# Patient Record
Sex: Male | Born: 1975 | Race: Black or African American | Hispanic: No | Smoking: Current every day smoker
Health system: Southern US, Community
[De-identification: ages and names within clinical notes are randomized; demographics above are authoritative.]

## PROBLEM LIST (undated history)

## (undated) DIAGNOSIS — E119 Type 2 diabetes mellitus without complications: Secondary | ICD-10-CM

---

## 2001-12-29 DIAGNOSIS — E785 Hyperlipidemia, unspecified: Secondary | ICD-10-CM | POA: Insufficient documentation

## 2001-12-29 DIAGNOSIS — E1165 Type 2 diabetes mellitus with hyperglycemia: Secondary | ICD-10-CM | POA: Insufficient documentation

## 2001-12-29 DIAGNOSIS — Z9889 Other specified postprocedural states: Secondary | ICD-10-CM | POA: Insufficient documentation

## 2001-12-29 DIAGNOSIS — F17200 Nicotine dependence, unspecified, uncomplicated: Secondary | ICD-10-CM | POA: Insufficient documentation

## 2001-12-29 DIAGNOSIS — I10 Essential (primary) hypertension: Secondary | ICD-10-CM | POA: Insufficient documentation

## 2003-04-26 DIAGNOSIS — B338 Other specified viral diseases: Secondary | ICD-10-CM | POA: Insufficient documentation

## 2017-12-17 ENCOUNTER — Other Ambulatory Visit: Payer: Self-pay

## 2017-12-17 ENCOUNTER — Emergency Department
Admission: EM | Admit: 2017-12-17 | Discharge: 2017-12-17 | Disposition: A | Discharge status: Home / Self Care | Payer: Self-pay | Attending: Student in an Organized Health Care Education/Training Program | Admitting: Student in an Organized Health Care Education/Training Program

## 2017-12-17 ENCOUNTER — Encounter: Payer: Self-pay | Admitting: Intensive Care

## 2017-12-17 DIAGNOSIS — M79601 Pain in right arm: Secondary | ICD-10-CM | POA: Insufficient documentation

## 2017-12-17 DIAGNOSIS — F17210 Nicotine dependence, cigarettes, uncomplicated: Secondary | ICD-10-CM | POA: Insufficient documentation

## 2017-12-17 DIAGNOSIS — R739 Hyperglycemia, unspecified: Secondary | ICD-10-CM

## 2017-12-17 DIAGNOSIS — E1165 Type 2 diabetes mellitus with hyperglycemia: Secondary | ICD-10-CM | POA: Insufficient documentation

## 2017-12-17 LAB — CBC WITH DIFFERENTIAL/PLATELET
BASOS ABS: 0.2 10*3/uL — AB (ref 0–0.1)
Basophils Relative: 1 %
Eosinophils Absolute: 0.4 10*3/uL (ref 0–0.7)
Eosinophils Relative: 4 %
HEMATOCRIT: 47.4 % (ref 40.0–52.0)
Hemoglobin: 15.8 g/dL (ref 13.0–18.0)
LYMPHS PCT: 21 %
Lymphs Abs: 2.5 10*3/uL (ref 1.0–3.6)
MCH: 29.4 pg (ref 26.0–34.0)
MCHC: 33.5 g/dL (ref 32.0–36.0)
MCV: 88 fL (ref 80.0–100.0)
Monocytes Absolute: 0.6 10*3/uL (ref 0.2–1.0)
Monocytes Relative: 5 %
NEUTROS ABS: 8.4 10*3/uL — AB (ref 1.4–6.5)
Neutrophils Relative %: 69 %
Platelets: 234 10*3/uL (ref 150–440)
RBC: 5.38 MIL/uL (ref 4.40–5.90)
RDW: 13.7 % (ref 11.5–14.5)
WBC: 12.1 10*3/uL — AB (ref 3.8–10.6)

## 2017-12-17 LAB — COMPREHENSIVE METABOLIC PANEL
ALK PHOS: 130 U/L — AB (ref 38–126)
ALT: 43 U/L (ref 17–63)
AST: 43 U/L — AB (ref 15–41)
Albumin: 4.3 g/dL (ref 3.5–5.0)
Anion gap: 9 (ref 5–15)
BUN: 18 mg/dL (ref 6–20)
CHLORIDE: 102 mmol/L (ref 101–111)
CO2: 22 mmol/L (ref 22–32)
CREATININE: 1.12 mg/dL (ref 0.61–1.24)
Calcium: 9.5 mg/dL (ref 8.9–10.3)
GFR calc Af Amer: >60 mL/min (ref >60)
Glucose, Bld: 328 mg/dL — ABNORMAL HIGH (ref 65–99)
Potassium: 4.5 mmol/L (ref 3.5–5.1)
Sodium: 133 mmol/L — ABNORMAL LOW (ref 135–145)
Total Bilirubin: 1.2 mg/dL (ref 0.3–1.2)
Total Protein: 8 g/dL (ref 6.5–8.1)

## 2017-12-17 LAB — GLUCOSE, CAPILLARY: Glucose-Capillary: 327 mg/dL — ABNORMAL HIGH (ref 65–99)

## 2017-12-17 MED ORDER — MELOXICAM 15 MG PO TABS: 15.0000 mg | ORAL_TABLET | Freq: Every day | ORAL | 0 refills | Status: DC

## 2017-12-17 NOTE — Discharge Instructions (Signed)
Please establish a primary care provider as soon as possible.  Return to the ER for symptoms that change or worsen if you are unable to schedule an appointment.

## 2017-12-17 NOTE — ED Triage Notes (Addendum)
Patient c/o Right arm pain while at work today. Patient states "my job told me to leave and come get it checked and bring them back a work note" Patient blood sugar 327 in triage. Does not take medicines for diabetes after losing weight.

## 2017-12-17 NOTE — ED Provider Notes (Signed)
[facility] Emergency Department Provider Note ____________________________________________  Time seen: Approximately 2:32 PM  I have reviewed the triage vital signs and the nursing notes.   HISTORY  Chief Complaint Arm Pain (Right)    HPI Jon EasterlySamuele Hoover is a 42 y.o. male who presents to the emergency department for evaluation and treatment of right arm pain.  Patient states that this is a chronic issue that occasionally flares up with overuse.  He states that while at work, he was trying to cut something and was unable to complete the job.  His supervisor sent him home and advised him not to return to work until he had been evaluated.  No alleviating measures have been attempted for this complaint prior to arrival.  History reviewed. No pertinent past medical history.  There are no active problems to display for this patient.   History reviewed. No pertinent surgical history.  Prior to Admission medications   Medication Sig Start Date End Date Taking? Authorizing Provider  meloxicam (MOBIC) 15 MG tablet Take 1 tablet (15 mg total) by mouth daily. 12/17/17   Chinita Pesterriplett, Carisma Troupe B, FNP    Allergies Patient has no known allergies.  History reviewed. No pertinent family history.  Social History Social History   Tobacco Use  . Smoking status: Current Every Day Smoker    Types: Cigarettes  . Smokeless tobacco: Never Used  Substance Use Topics  . Alcohol use: Yes    Comment: occ  . Drug use: No    Review of Systems Constitutional: Negative for recent illness or injury.  Positive for polydipsia Cardiovascular: Negative for chest pain or shortness of breath. Respiratory: Negative for cough. Musculoskeletal: Positive for right forearm pain Skin: Positive for scarring in the area of pain.  Negative for new injury. Neurological: Negative for paresthesias or weakness.  ____________________________________________   PHYSICAL EXAM:  VITAL SIGNS: ED Triage  Vitals [12/17/17 1344]  Enc Vitals Group     BP (!) 163/90     Pulse Rate (!) 109     Resp 18     Temp 98.7 F (37.1 C)     Temp Source Oral     SpO2 99 %     Weight 273 lb (123.8 kg)     Height 5\' 8"  (1.727 m)     Head Circumference      Peak Flow      Pain Score 6     Pain Loc      Pain Edu?      Excl. in GC?     Constitutional: Alert and oriented. Well appearing and in no acute distress. Eyes: Conjunctivae are clear without discharge or drainage Head: Atraumatic Neck: Supple Respiratory: Respirations are even and unlabored. Musculoskeletal: Full, active range of motion of the right hand, wrist, forearm, elbow, and shoulder is demonstrated.  Pain increases when fist is formed into grip. Neurologic: Grip strength is equal.  Motor and sensory function is intact. Skin: Well healed scar is noted directly over the area of pain.  No edema or erythema is noted.  No acute injury to the skin. Psychiatric: Affect and behavior are appropriate.  ____________________________________________   LABS (all labs ordered are listed, but only abnormal results are displayed)  Labs Reviewed  GLUCOSE, CAPILLARY - Abnormal; Notable for the following components:      Result Value   Glucose-Capillary 327 (*)    All other components within normal limits  CBC WITH DIFFERENTIAL/PLATELET - Abnormal; Notable for the following components:  WBC 12.1 (*)    Neutro Abs 8.4 (*)    Basophils Absolute 0.2 (*)    All other components within normal limits  COMPREHENSIVE METABOLIC PANEL - Abnormal; Notable for the following components:   Sodium 133 (*)    Glucose, Bld 328 (*)    AST 43 (*)    Alkaline Phosphatase 130 (*)    All other components within normal limits   ____________________________________________  RADIOLOGY  Not indicated ____________________________________________   PROCEDURES  Procedures  ____________________________________________   INITIAL IMPRESSION / ASSESSMENT AND  PLAN / ED COURSE  Jon Hoover is a 42 y.o. male who presents to the emergency department for evaluation and treatment of right arm pain.  Because the patient is a diabetic who is not currently taking any medications, labs were drawn in triage.  The patient's blood sugar was noted to be 328, however the patient is asymptomatic with the exception of polydipsia.  Patient states that he was taken off of his insulin and Amaryl after he lost quite a bit of weight.  He does not want to stay for IV fluids.  Lab studies are reassuring and there is no indication of DKA.  Patient was encouraged to adhere to his diabetic diet and call to schedule an appointment with his previous endocrinologist or the primary care provider of his choice.  He will be treated with meloxicam for his chronic arm pain.  He is to return to the emergency department for symptoms of concern  If he is unable to schedule an appointment with primary care.  Medications - No data to display  Pertinent labs & imaging results that were available during my care of the patient were reviewed by me and considered in my medical decision making (see chart for details).  _________________________________________   FINAL CLINICAL IMPRESSION(S) / ED DIAGNOSES  Final diagnoses:  Hyperglycemia  Right arm pain    ED Discharge Orders        Ordered    meloxicam (MOBIC) 15 MG tablet  Daily     12/17/17 1550       If controlled substance prescribed during this visit, 12 month history viewed on the [facility] prior to issuing an initial prescription for Schedule II or III opiod.    Chinita Pester, FNP 12/17/17 1609    Willy Eddy, MD 12/18/17 1359

## 2018-01-08 ENCOUNTER — Emergency Department: Payer: Self-pay

## 2018-01-08 ENCOUNTER — Other Ambulatory Visit: Payer: Self-pay

## 2018-01-08 ENCOUNTER — Emergency Department
Admission: EM | Admit: 2018-01-08 | Discharge: 2018-01-08 | Disposition: A | Discharge status: Home / Self Care | Payer: Self-pay | Attending: Emergency Medicine | Admitting: Emergency Medicine

## 2018-01-08 DIAGNOSIS — F17210 Nicotine dependence, cigarettes, uncomplicated: Secondary | ICD-10-CM | POA: Insufficient documentation

## 2018-01-08 DIAGNOSIS — Z0279 Encounter for issue of other medical certificate: Secondary | ICD-10-CM | POA: Insufficient documentation

## 2018-01-08 DIAGNOSIS — Z79899 Other long term (current) drug therapy: Secondary | ICD-10-CM | POA: Insufficient documentation

## 2018-01-08 DIAGNOSIS — R0789 Other chest pain: Secondary | ICD-10-CM | POA: Insufficient documentation

## 2018-01-08 DIAGNOSIS — R1110 Vomiting, unspecified: Secondary | ICD-10-CM | POA: Insufficient documentation

## 2018-01-08 DIAGNOSIS — R739 Hyperglycemia, unspecified: Secondary | ICD-10-CM | POA: Insufficient documentation

## 2018-01-08 LAB — HEPATIC FUNCTION PANEL
ALBUMIN: 3.6 g/dL (ref 3.5–5.0)
ALT: 44 U/L (ref 17–63)
AST: 44 U/L — AB (ref 15–41)
Alkaline Phosphatase: 109 U/L (ref 38–126)
Total Bilirubin: 0.7 mg/dL (ref 0.3–1.2)
Total Protein: 7.4 g/dL (ref 6.5–8.1)

## 2018-01-08 LAB — BASIC METABOLIC PANEL
ANION GAP: 10 (ref 5–15)
BUN: 16 mg/dL (ref 6–20)
CALCIUM: 9.2 mg/dL (ref 8.9–10.3)
CO2: 20 mmol/L — ABNORMAL LOW (ref 22–32)
Chloride: 102 mmol/L (ref 101–111)
Creatinine, Ser: 1.03 mg/dL (ref 0.61–1.24)
GLUCOSE: 346 mg/dL — AB (ref 65–99)
POTASSIUM: 4.2 mmol/L (ref 3.5–5.1)
SODIUM: 132 mmol/L — AB (ref 135–145)

## 2018-01-08 LAB — CBC
HEMATOCRIT: 43.2 % (ref 40.0–52.0)
HEMOGLOBIN: 14.6 g/dL (ref 13.0–18.0)
MCH: 29.8 pg (ref 26.0–34.0)
MCHC: 33.8 g/dL (ref 32.0–36.0)
MCV: 88 fL (ref 80.0–100.0)
Platelets: 208 10*3/uL (ref 150–440)
RBC: 4.91 MIL/uL (ref 4.40–5.90)
RDW: 13.3 % (ref 11.5–14.5)
WBC: 9.2 10*3/uL (ref 3.8–10.6)

## 2018-01-08 LAB — TROPONIN I: Troponin I: <0.03 ng/mL (ref <0.03)

## 2018-01-08 LAB — LIPASE, BLOOD: LIPASE: 44 U/L (ref 11–51)

## 2018-01-08 LAB — GLUCOSE, CAPILLARY: GLUCOSE-CAPILLARY: 257 mg/dL — AB (ref 65–99)

## 2018-01-08 MED ORDER — SODIUM CHLORIDE 0.9 % IV BOLUS (SEPSIS)
1000.0000 mL | Freq: Once | INTRAVENOUS | Status: AC
  Administered 2018-01-08: 1000 mL via INTRAVENOUS

## 2018-01-08 NOTE — Discharge Instructions (Signed)
Closely with cardiology, if you have chest pain, shortness of breath, nausea, vomiting or you feel worse in any way return to the emergency room.  You need to establish care with a primary care doctor, we are referring you to on, it is very important that she get your sugars under control.  Please return if you feel worse in any way.

## 2018-01-08 NOTE — ED Provider Notes (Addendum)
[facility] Emergency Department Provider Note  ____________________________________________   I have reviewed the triage vital signs and the nursing notes. Where available I have reviewed prior notes and, if possible and indicated, outside hospital notes.    HISTORY  Chief Complaint Chest Pain    HPI Jon Hoover is a 42 y.o. male   with a history of poorly controlled diabetes and obesity, no history of CAD denies family history of PE or DVT, states that he had pain in his left chest which was there for "a second or 2".  States that happened one time.  This is in distinction to what is in the triage note.  Patient is a somewhat reluctant historian it appears.  In any event, the pain happened while he was in the physical active vomiting.  He vomited one time and while he was vomiting not before not after he had a is completely gone.  He has no abdominal pain nausea or vomiting at this time.  He denies diarrhea.  Positive sick contacts with vomiting illness.  He denies dysuria urinary frequency, he denies headache stiff neck exertional symptoms or other complaints.  he does have a history of poorly controlled diabetes.  Essentially, it was uncomfortable to vomit.  Patient states "not much really came up I just gagged".  In addition, he is requesting a work note      History reviewed. No pertinent past medical history.  There are no active problems to display for this patient.   History reviewed. No pertinent surgical history.  Prior to Admission medications   Medication Sig Start Date End Date Taking? Authorizing Provider  meloxicam (MOBIC) 15 MG tablet Take 1 tablet (15 mg total) by mouth daily. 12/17/17   Chinita Pester, FNP    Allergies Patient has no known allergies.  No family history on file.  Social History Social History   Tobacco Use  . Smoking status: Current Every Day Smoker    Types: Cigarettes  . Smokeless tobacco: Never Used   Substance Use Topics  . Alcohol use: Yes    Comment: occ  . Drug use: No    Review of Systems Constitutional: No fever/chills Eyes: No visual changes. ENT: No sore throat. No stiff neck no neck pain Cardiovascular: See HPI Respiratory: Denies shortness of breath. Gastrointestinal:   N+ o vomiting.  No diarrhea.  No constipation. Genitourinary: Negative for dysuria. Musculoskeletal: Negative lower extremity swelling Skin: Negative for rash. Neurological: Negative for severe headaches, focal weakness or numbness.   ____________________________________________   PHYSICAL EXAM:  VITAL SIGNS: ED Triage Vitals  Enc Vitals Group     BP 01/08/18 0835 (!) 143/84     Pulse Rate 01/08/18 0835 91     Resp 01/08/18 0835 17     Temp 01/08/18 0835 98.3 F (36.8 C)     Temp Source 01/08/18 0835 Oral     SpO2 01/08/18 0835 98 %     Weight 01/08/18 0833 273 lb (123.8 kg)     Height 01/08/18 0833 5\' 8"  (1.727 m)     Head Circumference --      Peak Flow --      Pain Score 01/08/18 0833 4     Pain Loc --      Pain Edu? --      Excl. in GC? --     Constitutional: Alert and oriented. Well appearing and in no acute distress. Eyes: Conjunctivae are normal Head: Atraumatic HEENT: No congestion/rhinnorhea. Mucous  membranes are moist.  Oropharynx non-erythematous Neck:   Nontender with no meningismus, no masses, no stridor Cardiovascular: Normal rate, regular rhythm. Grossly normal heart sounds.  Good peripheral circulation. Respiratory: Normal respiratory effort.  No retractions. Lungs CTAB. Abdominal: Soft and nontender. No distention. No guarding no rebound Back:  There is no focal tenderness or step off.  there is no midline tenderness there are no lesions noted. there is no CVA tenderness  Musculoskeletal: No lower extremity tenderness, no upper extremity tenderness. No joint effusions, no DVT signs strong distal pulses no edema Neurologic:  Normal speech and language. No gross  focal neurologic deficits are appreciated.  Skin:  Skin is warm, dry and intact. No rash noted. Psychiatric: Mood and affect are normal. Speech and behavior are normal.  ____________________________________________   LABS (all labs ordered are listed, but only abnormal results are displayed)  Labs Reviewed  BASIC METABOLIC PANEL - Abnormal; Notable for the following components:      Result Value   Sodium 132 (*)    CO2 20 (*)    Glucose, Bld 346 (*)    All other components within normal limits  CBC  TROPONIN I    Pertinent labs  results that were available during my care of the patient were reviewed by me and considered in my medical decision making (see chart for details). ____________________________________________  EKG  I personally interpreted any EKGs ordered by me or triage Normal sinus rhythm rate 95 bpm no acute ST elevation or depression normal axis no acute changes ____________________________________________  RADIOLOGY  Pertinent labs & imaging results that were available during my care of the patient were reviewed by me and considered in my medical decision making (see chart for details). If possible, patient and/or family made aware of any abnormal findings.  Dg Chest 2 View  Result Date: 01/08/2018 CLINICAL DATA:  Pt c/o sharp left sided chest pain with nausea, vomiting and dizziness that started while he was at work today, states it has been intermittent since early this morning, smoker EXAM: CHEST - 2 VIEW COMPARISON:  None. FINDINGS: The heart size and mediastinal contours are within normal limits. Both lungs are clear. No pleural effusion or pneumothorax. The visualized skeletal structures are unremarkable. IMPRESSION: No active cardiopulmonary disease. Electronically Signed   By: Amie Portlandavid  Ormond M.D.   On: 01/08/2018 09:09   ____________________________________________    PROCEDURES  Procedure(s) performed: None  Procedures  Critical Care performed:  None  ____________________________________________   INITIAL IMPRESSION / ASSESSMENT AND PLAN / ED COURSE  Pertinent labs & imaging results that were available during my care of the patient were reviewed by me and considered in my medical decision making (see chart for details).  Personally reviewed x-ray Patient here with a very fleeting discomfort in his chest while he was in the physical active vomiting this morning.  He did not have any pain until he was actually in the for active vomiting since he stopped vomiting did not have any pain.  The abdomen is completely benign.  Denies any fever chills is not nauseated at this time.  He had no exertional symptoms.  Very atypical discomfort not likely to be ACS given history however we will send cardiac markers.  Patient's EKG is unremarkable.  He is without symptoms or complaint at this time.  Patient does [facility].  Initial care everywhere does not show this.  Could be a computer linkage issue at  this time.  ----------------------------------------- 12:09 PM on 01/08/2018 -----------------------------------------  Patient remains in the emergency department has not vomited once has no dysuria no urinary frequency no abdominal pain serial abdominal pains are benign, lungs are clear EKG is reassuring blood work is reassuring troponins are negative x2, very atypical discomfort, patient very much would prefer to go home other than her from [place] at this time.  Blood sugars trending down with IV fluids, he does not have an anion gap there is no evidence of DKA.  In short, he looks quite well feels quite well had a brief period of discomfort while vomiting this morning unclear why.  Certainly does not appear to be septic or infected, and well it is acknowledged that diabetic patients can have occult or atypical cardiac symptoms, at this time there is nothing to indicate that is what is going on with this  patient.  At this time, there does not appear to be clinical evidence to support the diagnosis of pulmonary embolus, dissection, myocarditis, endocarditis, pericarditis, pericardial tamponade, acute coronary syndrome, pneumothorax, pneumonia, or any other acute intrathoracic pathology that will require admission or acute intervention. Nor is there evidence of any significant intra-abdominal pathology causing this discomfort.  Considering the patient's symptoms, medical history, and physical examination today, I have low suspicion for cholecystitis or biliary pathology, pancreatitis, perforation or bowel obstruction, hernia, intra-abdominal abscess, AAA or dissection, volvulus or intussusception, mesenteric ischemia, ischemic gut, pyelonephritis or appendicitis.  Extensive return precautions and follow-up given and understood patient understands the need to follow closely as an outpatient to adjust his ineffectual diabetes regimen.  We are also referring him to cardiology given his risk factors      ____________________________________________   FINAL CLINICAL IMPRESSION(S) / ED DIAGNOSES  Final diagnoses:  None      This chart was dictated using voice recognition software.  Despite best efforts to proofread,  errors can occur which can change meaning.      Jeanmarie Plant, MD 01/08/18 6213    Jeanmarie Plant, MD 01/08/18 0865    Jeanmarie Plant, MD 01/08/18 7846    Jeanmarie Plant, MD 01/08/18 1213

## 2018-01-08 NOTE — ED Notes (Signed)
Patient to Room 3, Morrie Sheldonshley RN aware of placement in room.

## 2018-01-08 NOTE — ED Notes (Addendum)
First Nurse Note:  Patient states he needs to be seen because of his sugar and chest pain.  Did not check BS this AM, takes PO Metformin.  Chest pain started this AM at work.  Alert and oriented, declines WC.

## 2018-01-08 NOTE — ED Triage Notes (Signed)
Pt c/o sharp left sided chest pain with nausea, vomiting and dizziness that started while he was at work today, states it has been intermittent since early this morning..Marland Kitchen

## 2018-01-29 ENCOUNTER — Encounter: Payer: Self-pay | Admitting: Emergency Medicine

## 2018-01-29 ENCOUNTER — Emergency Department
Admission: EM | Admit: 2018-01-29 | Discharge: 2018-01-29 | Disposition: A | Discharge status: Home / Self Care | Payer: Self-pay | Attending: Emergency Medicine | Admitting: Emergency Medicine

## 2018-01-29 DIAGNOSIS — E1165 Type 2 diabetes mellitus with hyperglycemia: Secondary | ICD-10-CM | POA: Insufficient documentation

## 2018-01-29 DIAGNOSIS — Z7984 Long term (current) use of oral hypoglycemic drugs: Secondary | ICD-10-CM | POA: Insufficient documentation

## 2018-01-29 DIAGNOSIS — R739 Hyperglycemia, unspecified: Secondary | ICD-10-CM

## 2018-01-29 DIAGNOSIS — F17210 Nicotine dependence, cigarettes, uncomplicated: Secondary | ICD-10-CM | POA: Insufficient documentation

## 2018-01-29 HISTORY — DX: Type 2 diabetes mellitus without complications: E11.9

## 2018-01-29 LAB — CBC WITH DIFFERENTIAL/PLATELET
BASOS ABS: 0.1 10*3/uL (ref 0–0.1)
BASOS PCT: 1 %
EOS ABS: 0.3 10*3/uL (ref 0–0.7)
EOS PCT: 3 %
HCT: 45.4 % (ref 40.0–52.0)
HEMOGLOBIN: 15.4 g/dL (ref 13.0–18.0)
LYMPHS ABS: 1.9 10*3/uL (ref 1.0–3.6)
Lymphocytes Relative: 19 %
MCH: 29.5 pg (ref 26.0–34.0)
MCHC: 34 g/dL (ref 32.0–36.0)
MCV: 87 fL (ref 80.0–100.0)
Monocytes Absolute: 0.8 10*3/uL (ref 0.2–1.0)
Monocytes Relative: 8 %
NEUTROS PCT: 69 %
Neutro Abs: 6.8 10*3/uL — ABNORMAL HIGH (ref 1.4–6.5)
PLATELETS: 223 10*3/uL (ref 150–440)
RBC: 5.22 MIL/uL (ref 4.40–5.90)
RDW: 13.4 % (ref 11.5–14.5)
WBC: 9.9 10*3/uL (ref 3.8–10.6)

## 2018-01-29 LAB — BLOOD GAS, VENOUS
Acid-base deficit: 1.2 mmol/L (ref 0.0–2.0)
Bicarbonate: 23.6 mmol/L (ref 20.0–28.0)
O2 Saturation: 87.3 %
PCO2 VEN: 39 mmHg — AB (ref 44.0–60.0)
PH VEN: 7.39 (ref 7.250–7.430)
PO2 VEN: 54 mmHg — AB (ref 32.0–45.0)
Patient temperature: 37

## 2018-01-29 LAB — GLUCOSE, CAPILLARY
GLUCOSE-CAPILLARY: 330 mg/dL — AB (ref 65–99)
Glucose-Capillary: 406 mg/dL — ABNORMAL HIGH (ref 65–99)

## 2018-01-29 LAB — BASIC METABOLIC PANEL
ANION GAP: 9 (ref 5–15)
BUN: 17 mg/dL (ref 6–20)
CHLORIDE: 100 mmol/L — AB (ref 101–111)
CO2: 22 mmol/L (ref 22–32)
Calcium: 9.6 mg/dL (ref 8.9–10.3)
Creatinine, Ser: 0.91 mg/dL (ref 0.61–1.24)
GFR calc non Af Amer: >60 mL/min (ref >60)
Glucose, Bld: 379 mg/dL — ABNORMAL HIGH (ref 65–99)
POTASSIUM: 4.7 mmol/L (ref 3.5–5.1)
SODIUM: 131 mmol/L — AB (ref 135–145)

## 2018-01-29 LAB — BETA-HYDROXYBUTYRIC ACID: BETA-HYDROXYBUTYRIC ACID: 0.24 mmol/L (ref 0.05–0.27)

## 2018-01-29 LAB — BRAIN NATRIURETIC PEPTIDE: B NATRIURETIC PEPTIDE 5: 10 pg/mL (ref 0.0–100.0)

## 2018-01-29 LAB — TROPONIN I

## 2018-01-29 MED ORDER — GLIPIZIDE 5 MG PO TABS: 5.0000 mg | ORAL_TABLET | Freq: Two times a day (BID) | ORAL | 11 refills | Status: AC

## 2018-01-29 MED ORDER — SODIUM CHLORIDE 0.9 % IV SOLN
Freq: Once | INTRAVENOUS | Status: AC
  Administered 2018-01-29: 12:00:00 via INTRAVENOUS

## 2018-01-29 MED ORDER — GLIPIZIDE 5 MG PO TABS
5.0000 mg | ORAL_TABLET | Freq: Once | ORAL | Status: AC
  Administered 2018-01-29: 5 mg via ORAL
  Filled 2018-01-29: qty 1

## 2018-01-29 NOTE — ED Provider Notes (Addendum)
[facility] Emergency Department Provider Note       Time seen: ----------------------------------------- 11:13 AM on 01/29/2018 -----------------------------------------   I have reviewed the triage vital signs and the nursing notes.  HISTORY   Chief Complaint Hyperglycemia    HPI Jon Hoover is a 42 y.o. male with a history of diabetes who presents to the ED for concerns of high blood sugar.  Patient arrives to the ER by private vehicle from work with concerns for hyperglycemia.  Patient states he thinks his sugar is high and his coworker told him to come here for evaluation.  He states he has had polyuria and polydipsia.  He denies fevers, chills, chest pain, shortness of breath or other complaints.  Currently he is only taking metformin but used to take additional anti-hyperglycemics.  Past Medical History:  Diagnosis Date  . Diabetes mellitus without complication (HCC)     There are no active problems to display for this patient.   No past surgical history on file.  Allergies Patient has no known allergies.  Social History Social History   Tobacco Use  . Smoking status: Current Every Day Smoker    Types: Cigarettes  . Smokeless tobacco: Never Used  Substance Use Topics  . Alcohol use: Yes    Comment: occ  . Drug use: No    Review of Systems Constitutional: Negative for fever. Eyes: Negative for vision changes ENT: Positive for polydipsia Cardiovascular: Negative for chest pain. Respiratory: Negative for shortness of breath. Gastrointestinal: Negative for abdominal pain, vomiting and diarrhea. Genitourinary: Positive for polyuria Musculoskeletal: Negative for back pain. Skin: Negative for rash. Neurological: Negative for headaches, focal weakness or numbness.  All systems negative/normal/unremarkable except as stated in the HPI  ____________________________________________   PHYSICAL EXAM:  VITAL SIGNS: ED Triage Vitals  [01/29/18 1038]  Enc Vitals Group     BP (!) 143/91     Pulse Rate (!) 108     Resp 16     Temp 98.1 F (36.7 C)     Temp src      SpO2 98 %     Weight 268 lb (121.6 kg)     Height 5\' 8"  (1.727 m)     Head Circumference      Peak Flow      Pain Score 0     Pain Loc      Pain Edu?      Excl. in GC?    Constitutional: Alert and oriented. Well appearing and in no distress. Eyes: Conjunctivae are normal. Normal extraocular movements. ENT   Head: Normocephalic and atraumatic.   Nose: No congestion/rhinnorhea.   Mouth/Throat: Mucous membranes are moist.   Neck: No stridor. Cardiovascular: Rapid rate, regular rhythm. No murmurs, rubs, or gallops. Respiratory: Normal respiratory effort without tachypnea nor retractions. Breath sounds are clear and equal bilaterally. No wheezes/rales/rhonchi. Gastrointestinal: Soft and nontender. Normal bowel sounds Musculoskeletal: Nontender with normal range of motion in extremities. No lower extremity tenderness nor edema. Neurologic:  Normal speech and language. No gross focal neurologic deficits are appreciated.  Skin:  Skin is warm, dry and intact. No rash noted. Psychiatric: Mood and affect are normal. Speech and behavior are normal.  ____________________________________________  ED COURSE:  As part of my medical decision making, I reviewed the following data within the electronic MEDICAL RECORD NUMBER History obtained from family if available, nursing notes, old chart and ekg, as well as notes from prior ED visits. Patient presented for hyperglycemia, we will assess  with labs and imaging as indicated at this time.   Procedures ____________________________________________   LABS (pertinent positives/negatives)  Labs Reviewed  GLUCOSE, CAPILLARY - Abnormal; Notable for the following components:      Result Value   Glucose-Capillary 406 (*)    All other components within normal limits  CBC WITH DIFFERENTIAL/PLATELET - Abnormal;  Notable for the following components:   Neutro Abs 6.8 (*)    All other components within normal limits  BASIC METABOLIC PANEL - Abnormal; Notable for the following components:   Sodium 131 (*)    Chloride 100 (*)    Glucose, Bld 379 (*)    All other components within normal limits  BLOOD GAS, VENOUS - Abnormal; Notable for the following components:   pCO2, Ven 39 (*)    pO2, Ven 54.0 (*)    All other components within normal limits  TROPONIN I  BETA-HYDROXYBUTYRIC ACID  BRAIN NATRIURETIC PEPTIDE   EKG: Sinus tachycardia with a rate of 105 bpm, otherwise normal PR interval, normal QRS, normal QT. ____________________________________________  DIFFERENTIAL DIAGNOSIS   Hyperglycemia, DKA, electrolyte abnormality, dehydration  FINAL ASSESSMENT AND PLAN  Hyperglycemia   Plan: The patient had presented for high blood sugars. Patient's labs did reveal hyperglycemia but were otherwise unremarkable.  Patient does not appear to be in DKA.  Is currently on metformin, we will add glipizide and he is stable for outpatient follow-up.   Jon DashJohnathan E Williams, MD   Note: This note was generated in part or whole with voice recognition software. Voice recognition is usually quite accurate but there are transcription errors that can and very often do occur. I apologize for any typographical errors that were not detected and corrected.     [name], [name], MD 01/29/18 1248    [name], [name], MD 02/11/18 1250

## 2018-01-29 NOTE — ED Triage Notes (Signed)
Patient presents to ED via POV from work with concern for high blood sugar. Patient states "I think its high so my work told me to come over here and get checked out". A&O x4. Even and non labored respirations noted.

## 2018-01-29 NOTE — ED Notes (Signed)
AAOx3.  Skin warm and dry.  NAD 

## 2018-02-09 ENCOUNTER — Other Ambulatory Visit: Payer: Self-pay

## 2018-02-09 ENCOUNTER — Emergency Department
Admission: EM | Admit: 2018-02-09 | Discharge: 2018-02-09 | Disposition: A | Discharge status: Home / Self Care | Payer: Self-pay | Attending: Emergency Medicine | Admitting: Emergency Medicine

## 2018-02-09 DIAGNOSIS — E1165 Type 2 diabetes mellitus with hyperglycemia: Secondary | ICD-10-CM | POA: Insufficient documentation

## 2018-02-09 DIAGNOSIS — F17210 Nicotine dependence, cigarettes, uncomplicated: Secondary | ICD-10-CM | POA: Insufficient documentation

## 2018-02-09 DIAGNOSIS — Z7984 Long term (current) use of oral hypoglycemic drugs: Secondary | ICD-10-CM | POA: Insufficient documentation

## 2018-02-09 DIAGNOSIS — R739 Hyperglycemia, unspecified: Secondary | ICD-10-CM

## 2018-02-09 DIAGNOSIS — R1110 Vomiting, unspecified: Secondary | ICD-10-CM | POA: Insufficient documentation

## 2018-02-09 LAB — LIPASE, BLOOD: Lipase: 40 U/L (ref 11–51)

## 2018-02-09 LAB — COMPREHENSIVE METABOLIC PANEL
ALBUMIN: 4 g/dL (ref 3.5–5.0)
ALT: 44 U/L (ref 17–63)
AST: 33 U/L (ref 15–41)
Alkaline Phosphatase: 116 U/L (ref 38–126)
Anion gap: 7 (ref 5–15)
BUN: 15 mg/dL (ref 6–20)
CHLORIDE: 101 mmol/L (ref 101–111)
CO2: 24 mmol/L (ref 22–32)
Calcium: 10.1 mg/dL (ref 8.9–10.3)
Creatinine, Ser: 1.07 mg/dL (ref 0.61–1.24)
GFR calc Af Amer: >60 mL/min (ref >60)
GFR calc non Af Amer: >60 mL/min (ref >60)
GLUCOSE: 301 mg/dL — AB (ref 65–99)
POTASSIUM: 4.7 mmol/L (ref 3.5–5.1)
SODIUM: 132 mmol/L — AB (ref 135–145)
Total Bilirubin: 0.4 mg/dL (ref 0.3–1.2)
Total Protein: 7.8 g/dL (ref 6.5–8.1)

## 2018-02-09 LAB — URINALYSIS, COMPLETE (UACMP) WITH MICROSCOPIC
BACTERIA UA: NONE SEEN
Bilirubin Urine: NEGATIVE
Glucose, UA: >=500 mg/dL — AB
Ketones, ur: NEGATIVE mg/dL
NITRITE: NEGATIVE
PROTEIN: 100 mg/dL — AB
Specific Gravity, Urine: 1.028 (ref 1.005–1.030)
pH: 5 (ref 5.0–8.0)

## 2018-02-09 LAB — CBC
HEMATOCRIT: 45.2 % (ref 40.0–52.0)
HEMOGLOBIN: 15.1 g/dL (ref 13.0–18.0)
MCH: 29.6 pg (ref 26.0–34.0)
MCHC: 33.5 g/dL (ref 32.0–36.0)
MCV: 88.3 fL (ref 80.0–100.0)
Platelets: 244 10*3/uL (ref 150–440)
RBC: 5.12 MIL/uL (ref 4.40–5.90)
RDW: 13.2 % (ref 11.5–14.5)
WBC: 12.4 10*3/uL — ABNORMAL HIGH (ref 3.8–10.6)

## 2018-02-09 MED ORDER — ONDANSETRON HCL 4 MG PO TABS: 4.0000 mg | ORAL_TABLET | Freq: Three times a day (TID) | ORAL | 0 refills | Status: DC | PRN

## 2018-02-09 NOTE — ED Triage Notes (Signed)
Pt reports abd pain and vomiting since this am.  No diarrhea.  Pt is diabetic.  Pt alert  Speech clear.

## 2018-02-09 NOTE — Discharge Instructions (Addendum)
Please seek medical attention for any high fevers, chest pain, shortness of breath, change in behavior, persistent vomiting, bloody stool or any other new or concerning symptoms.  

## 2018-02-09 NOTE — ED Provider Notes (Signed)
[facility] Emergency Department Provider Note   ____________________________________________   I have reviewed the triage vital signs and the nursing notes.   HISTORY  Chief Complaint Abdominal Pain and Emesis   History limited by: Not Limited   HPI Jon Hoover is a 42 y.o. male who presents to the emergency department today because of concern for nausea and vomiting. The patient states that his symptoms started this morning. Patient has not had any associated abdominal pain. Denies any associated diarrhea. No known sick contacts. No fevers. By the time of my examination the patient states that he is feeling better.  Per medical record review patient has a history of DM  Past Medical History:  Diagnosis Date  . Diabetes mellitus without complication (HCC)     There are no active problems to display for this patient.   No past surgical history on file.  Prior to Admission medications   Medication Sig Start Date End Date Taking? Authorizing Provider  glipiZIDE (GLUCOTROL) 5 MG tablet Take 1 tablet (5 mg total) by mouth 2 (two) times daily. 01/29/18 01/29/19  [name], [name], MD  meloxicam (MOBIC) 15 MG tablet Take 1 tablet (15 mg total) by mouth daily. Patient not taking: Reported on 01/08/2018 12/17/17   Kem Boroughsriplett, Cari B, FNP  metFORMIN (GLUCOPHAGE) 1000 MG tablet Take 1,000 mg by mouth 2 (two) times daily. 01/01/18 01/31/18  [provider]    Allergies Patient has no known allergies.  No family history on file.  Social History Social History   Tobacco Use  . Smoking status: Current Every Day Smoker    Types: Cigarettes  . Smokeless tobacco: Never Used  Substance Use Topics  . Alcohol use: Yes    Comment: occ  . Drug use: No    Review of Systems Constitutional: No fever/chills Eyes: No visual changes. ENT: No sore throat. Cardiovascular: Denies chest pain. Respiratory: Denies shortness of breath. Gastrointestinal: Positive  for nausea and vomiting.  Genitourinary: Negative for dysuria. Musculoskeletal: Negative for back pain. Skin: Negative for rash. Neurological: Negative for headaches, focal weakness or numbness.  ____________________________________________   PHYSICAL EXAM:  VITAL SIGNS: ED Triage Vitals  Enc Vitals Group     BP 02/09/18 1623 128/82     Pulse Rate 02/09/18 1623 (!) 105     Resp 02/09/18 1623 18     Temp 02/09/18 1623 99 F (37.2 C)     Temp Source 02/09/18 1623 Oral     SpO2 02/09/18 1623 99 %     Weight 02/09/18 1621 262 lb (118.8 kg)     Height 02/09/18 1621 5\' 8"  (1.727 m)     Head Circumference --      Peak Flow --      Pain Score 02/09/18 1621 3   Constitutional: Alert and oriented. Well appearing and in no distress. Eyes: Conjunctivae are normal.  ENT   Head: Normocephalic and atraumatic.   Nose: No congestion/rhinnorhea.   Mouth/Throat: Mucous membranes are moist.   Neck: No stridor. Hematological/Lymphatic/Immunilogical: No cervical lymphadenopathy. Cardiovascular: Normal rate, regular rhythm.  No murmurs, rubs, or gallops.  Respiratory: Normal respiratory effort without tachypnea nor retractions. Breath sounds are clear and equal bilaterally. No wheezes/rales/rhonchi. Gastrointestinal: Soft and non tender. No rebound. No guarding.  Genitourinary: Deferred Musculoskeletal: Normal range of motion in all extremities. No lower extremity edema. Neurologic:  Normal speech and language. No gross focal neurologic deficits are appreciated.  Skin:  Skin is warm, dry and intact. No rash  noted. Psychiatric: Mood and affect are normal. Speech and behavior are normal. Patient exhibits appropriate insight and judgment.  ____________________________________________    LABS (pertinent positives/negatives)  Lipase 40 CMP na 132, k 4.7, glu 301, cr 1.07 CBC wbc 12.4, hgb 15.1, plt 244 UA wbc 6-30, trace  leukocytes  ____________________________________________   EKG  None  ____________________________________________    RADIOLOGY  None  ____________________________________________   PROCEDURES  Procedures  ____________________________________________   INITIAL IMPRESSION / ASSESSMENT AND PLAN / ED COURSE  Pertinent labs & imaging results that were available during my care of the patient were reviewed by me and considered in my medical decision making (see chart for details).  Patient presented to the emergency department today because of concerns for nausea and vomiting.  By the time my exam the patient was feeling better.  Differential would be broad including gallbladder disease, gastroenteritis, gastroparesis, DKA amongst other etiologies.  Patient's workup without any concerning findings.  He did feel better at the time my exam and felt comfortable going home.  _______________________________________   FINAL CLINICAL IMPRESSION(S) / ED DIAGNOSES  Final diagnoses:  Vomiting, intractability of vomiting not specified, presence of nausea not specified, unspecified vomiting type  Elevated blood sugar     Note: This dictation was prepared with Dragon dictation. Any transcriptional errors that result from this process are unintentional     Phineas Semen, MD 02/09/18 2214

## 2018-02-23 ENCOUNTER — Emergency Department
Admission: EM | Admit: 2018-02-23 | Discharge: 2018-02-24 | Disposition: A | Discharge status: Home / Self Care | Payer: Self-pay | Attending: Emergency Medicine | Admitting: Emergency Medicine

## 2018-02-23 ENCOUNTER — Encounter: Payer: Self-pay | Admitting: Emergency Medicine

## 2018-02-23 ENCOUNTER — Other Ambulatory Visit: Payer: Self-pay

## 2018-02-23 DIAGNOSIS — Z7984 Long term (current) use of oral hypoglycemic drugs: Secondary | ICD-10-CM | POA: Insufficient documentation

## 2018-02-23 DIAGNOSIS — F17210 Nicotine dependence, cigarettes, uncomplicated: Secondary | ICD-10-CM | POA: Insufficient documentation

## 2018-02-23 DIAGNOSIS — R739 Hyperglycemia, unspecified: Secondary | ICD-10-CM

## 2018-02-23 DIAGNOSIS — E1165 Type 2 diabetes mellitus with hyperglycemia: Secondary | ICD-10-CM | POA: Insufficient documentation

## 2018-02-23 LAB — GLUCOSE, CAPILLARY: GLUCOSE-CAPILLARY: 209 mg/dL — AB (ref 65–99)

## 2018-02-23 MED ORDER — SODIUM CHLORIDE 0.9 % IV BOLUS
1000.0000 mL | INTRAVENOUS | Status: AC
  Administered 2018-02-23: 1000 mL via INTRAVENOUS

## 2018-02-23 NOTE — ED Triage Notes (Signed)
Patient ambulatory to triage with steady gait, without difficulty or distress noted; pt reports FS [id number] at home accomp by N/V

## 2018-02-24 LAB — BASIC METABOLIC PANEL
ANION GAP: 6 (ref 5–15)
BUN: 20 mg/dL (ref 6–20)
CALCIUM: 9.6 mg/dL (ref 8.9–10.3)
CHLORIDE: 106 mmol/L (ref 101–111)
CO2: 24 mmol/L (ref 22–32)
Creatinine, Ser: 1.23 mg/dL (ref 0.61–1.24)
GFR calc non Af Amer: >60 mL/min (ref >60)
Glucose, Bld: 222 mg/dL — ABNORMAL HIGH (ref 65–99)
Potassium: 4.4 mmol/L (ref 3.5–5.1)
SODIUM: 136 mmol/L (ref 135–145)

## 2018-02-24 LAB — URINALYSIS, COMPLETE (UACMP) WITH MICROSCOPIC
BILIRUBIN URINE: NEGATIVE
Bacteria, UA: NONE SEEN
Glucose, UA: >=500 mg/dL — AB
KETONES UR: NEGATIVE mg/dL
LEUKOCYTES UA: NEGATIVE
NITRITE: NEGATIVE
PROTEIN: 100 mg/dL — AB
Specific Gravity, Urine: 1.015 (ref 1.005–1.030)
pH: 5 (ref 5.0–8.0)

## 2018-02-24 LAB — CBC
HCT: 43.3 % (ref 40.0–52.0)
HEMOGLOBIN: 14.6 g/dL (ref 13.0–18.0)
MCH: 30.3 pg (ref 26.0–34.0)
MCHC: 33.8 g/dL (ref 32.0–36.0)
MCV: 89.6 fL (ref 80.0–100.0)
PLATELETS: 233 10*3/uL (ref 150–440)
RBC: 4.83 MIL/uL (ref 4.40–5.90)
RDW: 13.5 % (ref 11.5–14.5)
WBC: 10.9 10*3/uL — AB (ref 3.8–10.6)

## 2018-02-24 NOTE — Discharge Instructions (Signed)
As we discussed, though your blood sugar is running high, it is not dangerous at this time.  Making adjustments in the Emergency Department (ED) and possibly causing your glucose level to drop too low is more dangerous than continuing your current medications at this time until you can follow up with your clinic doctor.  Please continue your medications and follow up with your regular doctor as recommended in these documents.  Only take your medications as instructed on the labels of the bottles.  If you develop new or worsening symptoms that concern you, please return to the Emergency Department.

## 2018-02-24 NOTE — ED Provider Notes (Signed)
[facility] Emergency Department Provider Note  ____________________________________________   First MD Initiated Contact with Patient 02/24/18 0005     (approximate)  I have reviewed the triage vital signs and the nursing notes.   HISTORY  Chief Complaint Hyperglycemia    HPI Jon Hoover is a 42 y.o. male with medical history as listed below and who has had multiple prior emergency department visits for hyperglycemia who presents by private vehicle for evaluation of hyperglycemia.  He states that he has been taking his medicine including both insulin and oral medication.  He ate a big dinner tonight and then felt like his blood sugar went up.  He got nauseated and threw up once.  He took an additional glipizide over his normal dose and then he felt better but he thought he should get checked out.  He is currently asymptomatic, watching TV and in no distress.  He says the onset of the symptoms was acute and severe but now it is resolved.  Nothing in particular makes his symptoms better or worse other than taking the glipizide seems to have improved it.  He denies weakness, numbness, tingling, headache, chest pain, shortness of breath, abdominal pain, and dysuria.  He states that he has a new doctor that he is supposed to see in about a month..   Past Medical History:  Diagnosis Date  . Diabetes mellitus without complication (HCC)     There are no active problems to display for this patient.   History reviewed. No pertinent surgical history.  Prior to Admission medications   Medication Sig Start Date End Date Taking? Authorizing Provider  glipiZIDE (GLUCOTROL) 5 MG tablet Take 1 tablet (5 mg total) by mouth 2 (two) times daily. 01/29/18 01/29/19  Emily Filbert, MD  meloxicam (MOBIC) 15 MG tablet Take 1 tablet (15 mg total) by mouth daily. Patient not taking: Reported on 01/08/2018 12/17/17   Kem Boroughs B, FNP  metFORMIN (GLUCOPHAGE) 1000 MG tablet  Take 1,000 mg by mouth 2 (two) times daily. 01/01/18 01/31/18  [provider]  ondansetron (ZOFRAN) 4 MG tablet Take 1 tablet (4 mg total) by mouth every 8 (eight) hours as needed for nausea or vomiting. 02/09/18   Phineas Semen, MD    Allergies Patient has no known allergies.  No family history on file.  Social History Social History   Tobacco Use  . Smoking status: Current Every Day Smoker    Types: Cigarettes  . Smokeless tobacco: Never Used  Substance Use Topics  . Alcohol use: Yes    Comment: occ  . Drug use: No    Review of Systems Constitutional: No fever/chills Eyes: No visual changes. ENT: No sore throat. Cardiovascular: Denies chest pain. Respiratory: Denies shortness of breath. Gastrointestinal: No abdominal pain.  Nausea and one episode of vomiting, nausea now resolved.  No diarrhea.  No constipation. Genitourinary: Negative for dysuria. Musculoskeletal: Negative for neck pain.  Negative for back pain. Integumentary: Negative for rash. Neurological: Negative for headaches, focal weakness or numbness.   ____________________________________________   PHYSICAL EXAM:  VITAL SIGNS: ED Triage Vitals  Enc Vitals Group     BP 02/23/18 2323 (!) 153/84     Pulse Rate 02/23/18 2323 (!) 101     Resp 02/23/18 2323 20     Temp 02/23/18 2323 98 F (36.7 C)     Temp Source 02/23/18 2323 Oral     SpO2 02/23/18 2323 98 %     Weight 02/23/18 2322  118.8 kg (262 lb)     Height 02/23/18 2322 1.727 m ( )     Head Circumference --      Peak Flow --      Pain Score 02/23/18 2322 0     Pain Loc --      Pain Edu? --      Excl. in GC? --     Constitutional: Alert and oriented. Well appearing and in no acute distress. Eyes: Conjunctivae are normal.  Head: Atraumatic. Nose: No congestion/rhinnorhea. Mouth/Throat: Mucous membranes are moist. Neck: No stridor.  No meningeal signs.   Cardiovascular: Normal rate, regular rhythm. Good peripheral circulation.  Grossly normal heart sounds. Respiratory: Normal respiratory effort.  No retractions. Lungs CTAB. Gastrointestinal: Soft and nontender. No distention.  Musculoskeletal: No lower extremity tenderness nor edema. No gross deformities of extremities. Neurologic:  Normal speech and language. No gross focal neurologic deficits are appreciated.  Skin:  Skin is warm, dry and intact. No rash noted. Psychiatric: Mood and affect are normal. Speech and behavior are normal.  ____________________________________________   LABS (all labs ordered are listed, but only abnormal results are displayed)  Labs Reviewed  BASIC METABOLIC PANEL - Abnormal; Notable for the following components:      Result Value   Glucose, Bld 222 (*)    All other components within normal limits  CBC - Abnormal; Notable for the following components:   WBC 10.9 (*)    All other components within normal limits  URINALYSIS, COMPLETE (UACMP) WITH MICROSCOPIC - Abnormal; Notable for the following components:   Color, Urine YELLOW (*)    APPearance CLEAR (*)    Glucose, UA >=500 (*)    Hgb urine dipstick SMALL (*)    Protein, ur 100 (*)    All other components within normal limits  GLUCOSE, CAPILLARY - Abnormal; Notable for the following components:   Glucose-Capillary 209 (*)    All other components within normal limits  CBG MONITORING, ED   ____________________________________________  EKG  None - EKG not ordered by ED physician ____________________________________________  RADIOLOGY   ED MD interpretation: No indication for imaging  Official radiology report(s): No results found.  ____________________________________________   PROCEDURES  Critical Care performed: No   Procedure(s) performed:   Procedures   ____________________________________________   INITIAL IMPRESSION / ASSESSMENT AND PLAN / ED COURSE  As part of my medical decision making, I reviewed the following data within the electronic  MEDICAL RECORD NUMBER Nursing notes reviewed and incorporated, Labs reviewed , Old chart reviewed and Notes from prior ED visits    Differential diagnosis includes, but is not limited to, hyperglycemia, DKA, HHNS, other metabolic or electrolyte derangement, infectious process.  Fortunately the patient has no signs or symptoms of infection.  His vital signs are stable.  Lab work is reassuring except for glucosuria and a relatively mild hyperglycemia.  He has been stable during the time in the emergency department he got 1 L of normal saline by IV.  He is ready to go and I see no indication for additional work-up.  He has a normal anion gap and is well-appearing and asymptomatic.  I gave my usual and customary return precautions.      ____________________________________________  FINAL CLINICAL IMPRESSION(S) / ED DIAGNOSES  Final diagnoses:  Hyperglycemia     MEDICATIONS GIVEN DURING THIS VISIT:  Medications  sodium chloride 0.9 % bolus 1,000 mL (0 mLs Intravenous Stopped 02/24/18 0130)     ED Discharge Orders  None       Note:  This document was prepared using Dragon voice recognition software and may include unintentional dictation errors.    Loleta Rose, MD 02/24/18 [phone]

## 2018-02-25 ENCOUNTER — Encounter: Payer: Self-pay | Admitting: Emergency Medicine

## 2018-02-25 ENCOUNTER — Other Ambulatory Visit: Payer: Self-pay

## 2018-02-25 DIAGNOSIS — K2900 Acute gastritis without bleeding: Secondary | ICD-10-CM | POA: Insufficient documentation

## 2018-02-25 DIAGNOSIS — E1165 Type 2 diabetes mellitus with hyperglycemia: Secondary | ICD-10-CM | POA: Insufficient documentation

## 2018-02-25 DIAGNOSIS — F17210 Nicotine dependence, cigarettes, uncomplicated: Secondary | ICD-10-CM | POA: Insufficient documentation

## 2018-02-25 DIAGNOSIS — Z7984 Long term (current) use of oral hypoglycemic drugs: Secondary | ICD-10-CM | POA: Insufficient documentation

## 2018-02-25 LAB — CBC
HEMATOCRIT: 42.6 % (ref 40.0–52.0)
Hemoglobin: 14.6 g/dL (ref 13.0–18.0)
MCH: 30.6 pg (ref 26.0–34.0)
MCHC: 34.3 g/dL (ref 32.0–36.0)
MCV: 89.2 fL (ref 80.0–100.0)
Platelets: 237 10*3/uL (ref 150–440)
RBC: 4.78 MIL/uL (ref 4.40–5.90)
RDW: 13.6 % (ref 11.5–14.5)
WBC: 11.1 10*3/uL — AB (ref 3.8–10.6)

## 2018-02-25 LAB — COMPREHENSIVE METABOLIC PANEL
ALT: 47 U/L (ref 17–63)
AST: 37 U/L (ref 15–41)
Albumin: 3.9 g/dL (ref 3.5–5.0)
Alkaline Phosphatase: 97 U/L (ref 38–126)
Anion gap: 8 (ref 5–15)
BILIRUBIN TOTAL: 0.4 mg/dL (ref 0.3–1.2)
BUN: 17 mg/dL (ref 6–20)
CO2: 23 mmol/L (ref 22–32)
CREATININE: 1.03 mg/dL (ref 0.61–1.24)
Calcium: 9.7 mg/dL (ref 8.9–10.3)
Chloride: 105 mmol/L (ref 101–111)
GFR calc Af Amer: >60 mL/min (ref >60)
Glucose, Bld: 272 mg/dL — ABNORMAL HIGH (ref 65–99)
POTASSIUM: 4.2 mmol/L (ref 3.5–5.1)
Sodium: 136 mmol/L (ref 135–145)
TOTAL PROTEIN: 7.3 g/dL (ref 6.5–8.1)

## 2018-02-25 LAB — URINALYSIS, COMPLETE (UACMP) WITH MICROSCOPIC
Bacteria, UA: NONE SEEN
Bilirubin Urine: NEGATIVE
Ketones, ur: NEGATIVE mg/dL
Leukocytes, UA: NEGATIVE
Nitrite: NEGATIVE
PH: 6 (ref 5.0–8.0)
Protein, ur: 100 mg/dL — AB
Specific Gravity, Urine: 1.023 (ref 1.005–1.030)

## 2018-02-25 LAB — LIPASE, BLOOD: Lipase: 44 U/L (ref 11–51)

## 2018-02-25 LAB — TROPONIN I

## 2018-02-25 NOTE — ED Triage Notes (Signed)
Patient ambulatory to triage with steady gait, without difficulty or distress noted; Pt c/o left upper abd pain radiating around into back since yesterday; V x 1 that appeared to have blood

## 2018-02-26 ENCOUNTER — Emergency Department
Admission: EM | Admit: 2018-02-26 | Discharge: 2018-02-26 | Disposition: A | Discharge status: Home / Self Care | Payer: Self-pay | Attending: Emergency Medicine | Admitting: Emergency Medicine

## 2018-02-26 ENCOUNTER — Emergency Department: Payer: Self-pay

## 2018-02-26 DIAGNOSIS — R112 Nausea with vomiting, unspecified: Secondary | ICD-10-CM

## 2018-02-26 DIAGNOSIS — R1012 Left upper quadrant pain: Secondary | ICD-10-CM

## 2018-02-26 DIAGNOSIS — R739 Hyperglycemia, unspecified: Secondary | ICD-10-CM

## 2018-02-26 DIAGNOSIS — K2900 Acute gastritis without bleeding: Secondary | ICD-10-CM

## 2018-02-26 MED ORDER — ONDANSETRON HCL 4 MG/2ML IJ SOLN
4.0000 mg | Freq: Once | INTRAMUSCULAR | Status: AC
Start: 2018-02-26 — End: 2018-02-26
  Administered 2018-02-26: 4 mg via INTRAVENOUS
  Filled 2018-02-26: qty 2

## 2018-02-26 MED ORDER — ONDANSETRON 4 MG PO TBDP: 4.0000 mg | ORAL_TABLET | Freq: Three times a day (TID) | ORAL | 0 refills | Status: DC | PRN

## 2018-02-26 MED ORDER — FAMOTIDINE 20 MG PO TABS: 20.0000 mg | ORAL_TABLET | Freq: Two times a day (BID) | ORAL | 0 refills | Status: DC

## 2018-02-26 MED ORDER — SUCRALFATE 1 GM/10ML PO SUSP: 1.0000 g | Freq: Four times a day (QID) | ORAL | 1 refills | Status: DC

## 2018-02-26 MED ORDER — FAMOTIDINE IN NACL 20-0.9 MG/50ML-% IV SOLN
20.0000 mg | Freq: Once | INTRAVENOUS | Status: AC
  Administered 2018-02-26: 20 mg via INTRAVENOUS
  Filled 2018-02-26: qty 50

## 2018-02-26 MED ORDER — SODIUM CHLORIDE 0.9 % IV BOLUS
1000.0000 mL | Freq: Once | INTRAVENOUS | Status: AC
  Administered 2018-02-26: 1000 mL via INTRAVENOUS

## 2018-02-26 NOTE — Discharge Instructions (Addendum)
1.  Start Pepcid 20 mg twice daily.  This will help the pain in your stomach. 2.  Start Carafate 3 times daily with meals and at bedtime.  This will also help the pain in your stomach. 3.  You may take Zofran as needed for nausea/vomiting. 4.  Drink clear liquids for the next 12 hours, then bland diet for the next 3 days, then slowly advance diet as tolerated.  Avoid heavy, greasy, fatty, spicy foods and alcohol. 5.  Return to the ER for worsening symptoms, persistent vomiting, difficulty breathing or other concerns.

## 2018-02-26 NOTE — ED Provider Notes (Signed)
[facility] Emergency Department Provider Note   ____________________________________________   First MD Initiated Contact with Patient 02/26/18 0144     (approximate)  I have reviewed the triage vital signs and the nursing notes.   HISTORY  Chief Complaint Abdominal Pain    HPI Jon Hoover is a 42 y.o. male who presents to the ED from home with a chief complaint of upper abdominal pain, nausea/vomiting.  Patient has a history of diabetes who was seen frequently in the emergency department for hyperglycemia.  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  He saw bloody streaks in his vomit.  Denies recent fever, chills, chest pain, shortness of breath, dysuria, diarrhea.  Denies use of anticoagulants.   Past Medical History:  Diagnosis Date  . Diabetes mellitus without complication (HCC)     There are no active problems to display for this patient.   History reviewed. No pertinent surgical history.  Prior to Admission medications   Medication Sig Start Date End Date Taking? Authorizing Provider  glipiZIDE (GLUCOTROL) 5 MG tablet Take 1 tablet (5 mg total) by mouth 2 (two) times daily. 01/29/18 01/29/19  Emily Filbert, MD  meloxicam (MOBIC) 15 MG tablet Take 1 tablet (15 mg total) by mouth daily. Patient not taking: Reported on 01/08/2018 12/17/17   Kem Boroughs B, FNP  metFORMIN (GLUCOPHAGE) 1000 MG tablet Take 1,000 mg by mouth 2 (two) times daily. 01/01/18 01/31/18  [provider]  ondansetron (ZOFRAN) 4 MG tablet Take 1 tablet (4 mg total) by mouth every 8 (eight) hours as needed for nausea or vomiting. 02/09/18   Phineas Semen, MD    Allergies Patient has no known allergies.  No family history on file.  Social History Social History   Tobacco Use  . Smoking status: Current Every Day Smoker    Types: Cigarettes    . Smokeless tobacco: Never Used  Substance Use Topics  . Alcohol use: Yes    Comment: occ  . Drug use: No    Review of Systems  Constitutional: No fever/chills. Eyes: No visual changes. ENT: No sore throat. Cardiovascular: Denies chest pain. Respiratory: Denies shortness of breath. Gastrointestinal: Positive for abdominal pain, nausea and vomiting.  No diarrhea.  No constipation. Genitourinary: Negative for dysuria. Musculoskeletal: Negative for back pain. Skin: Negative for rash. Neurological: Negative for headaches, focal weakness or numbness.   ____________________________________________   PHYSICAL EXAM:  VITAL SIGNS: ED Triage Vitals  Enc Vitals Group     BP 02/25/18 2315 139/90     Pulse Rate 02/25/18 2315 98     Resp 02/25/18 2315 18     Temp 02/25/18 2315 98.5 F (36.9 C)     Temp Source 02/25/18 2315 Oral     SpO2 02/25/18 2315 97 %     Weight 02/25/18 2300 260 lb (117.9 kg)     Height 02/25/18 2300  (1.753 m)     Head Circumference --      Peak Flow --      Pain Score 02/25/18 2300 7     Pain Loc --      Pain Edu? --      Excl. in GC? --     Constitutional: Alert and oriented. Well appearing and in no acute distress. Eyes: Conjunctivae are normal. PERRL. EOMI. Head: Atraumatic. Nose: No congestion/rhinnorhea. Mouth/Throat: Mucous membranes are moist.  Oropharynx non-erythematous. Neck: No stridor.   Cardiovascular: Normal rate, regular rhythm. Grossly normal heart sounds.  Good peripheral circulation. Respiratory: Normal respiratory effort.  No retractions. Lungs CTAB. Gastrointestinal: Soft and mildly tender to palpation epigastrium without rebound or guarding. No distention. No abdominal bruits. No CVA tenderness. Musculoskeletal: No lower extremity tenderness nor edema.  No joint effusions. Neurologic:  Normal speech and language. No gross focal neurologic deficits are appreciated. No gait instability. Skin:  Skin is warm, dry and intact.  No rash noted. Psychiatric: Mood and affect are normal. Speech and behavior are normal.  ____________________________________________   LABS (all labs ordered are listed, but only abnormal results are displayed)  Labs Reviewed  CBC - Abnormal; Notable for the following components:      Result Value   WBC 11.1 (*)    All other components within normal limits  COMPREHENSIVE METABOLIC PANEL - Abnormal; Notable for the following components:   Glucose, Bld 272 (*)    All other components within normal limits  URINALYSIS, COMPLETE (UACMP) WITH MICROSCOPIC - Abnormal; Notable for the following components:   Color, Urine YELLOW (*)    APPearance HAZY (*)    Glucose, UA >=500 (*)    Hgb urine dipstick SMALL (*)    Protein, ur 100 (*)    All other components within normal limits  TROPONIN I  LIPASE, BLOOD   ____________________________________________  EKG  ED ECG REPORT I, Danamarie Minami J, the attending physician, personally viewed and interpreted this ECG.   Date: 02/26/2018  EKG Time: 2308  Rate: 98  Rhythm: normal EKG, normal sinus rhythm  Axis: Normal  Intervals:none  ST&T Change: Nonspecific  ____________________________________________  RADIOLOGY  ED MD interpretation: No cholecystitis  Official radiology report(s): US Abdomen Limited Ruq  Result Date: 02/26/2018 CLINICAL DATA:  Epigastric and left upper quadrant pain radiating to the back since yesterday. EXAM: ULTRASOUND ABDOMEN LIMITED RIGHT UPPER QUADRANT COMPARISON:  None. FINDINGS: Gallbladder: No gallstones or wall thickening visualized. No sonographic Murphy sign noted by sonographer. Common bile duct: Diameter: 3 mm, normal Liver: Focal hypoechoic area adjacent to the gallbladder measuring about 2.6 cm maximal diameter. This likely represents a focal area of fatty sparing. The remainder the liver demonstrates diffusely increased hepatic parenchymal echotexture consistent with diffuse fatty infiltration. Portal vein  is patent on color Doppler imaging with normal direction of blood flow towards the liver. IMPRESSION: No evidence of cholelithiasis or cholecystitis. Diffuse fatty infiltration of the liver with focal sparing adjacent to the gallbladder fossa. Electronically Signed   By: Burman Nieves M.D.   On: 02/26/2018 02:56    ____________________________________________   PROCEDURES  Procedure(s) performed: None  Procedures  Critical Care performed: No  ____________________________________________   INITIAL IMPRESSION / ASSESSMENT AND PLAN / ED COURSE  As part of my medical decision making, I reviewed the following data within the electronic MEDICAL RECORD NUMBER Nursing notes reviewed and incorporated, Labs reviewed, EKG interpreted, Old chart reviewed and Notes from prior ED visits   42 year old male with diabetes, recurrent hyperglycemia, who presents with upper abdominal pain with nausea and vomiting. Differential diagnosis includes, but is not limited to, biliary disease (biliary colic, acute cholecystitis, cholangitis, choledocholithiasis, etc), intrathoracic causes for epigastric abdominal pain including ACS, gastritis, duodenitis, pancreatitis, small bowel or large bowel obstruction, abdominal aortic aneurysm, hernia, and gastritis.  Laboratory results remarkable for hyperglycemia.  Will initiate IV fluid resuscitation, IV Pepcid for abdominal discomfort, IV Zofran for antiemetic, and proceed to RUQ ultrasound to evaluate for cholecystitis.  Clinical Course as of Feb 26 301  Thu Feb 26, 2018  0301 Patient resting in no acute distress.  Feeling better after Pepcid.  Updated him on ultrasound results.  Will start patient on Pepcid, Carafate and refer to GI for follow-up.  Strict return precautions given.  Patient verbalizes understanding and agrees with plan of care.   [JS]    Clinical Course User Index [JS] Irean Hong, MD     ____________________________________________   FINAL  CLINICAL IMPRESSION(S) / ED DIAGNOSES  Final diagnoses:  Left upper quadrant pain  Acute gastritis without hemorrhage, unspecified gastritis type  Non-intractable vomiting with nausea, unspecified vomiting type  Hyperglycemia     ED Discharge Orders    None       Note:  This document was prepared using Dragon voice recognition software and may include unintentional dictation errors.    Irean Hong, MD 02/26/18 [phone]

## 2018-08-09 ENCOUNTER — Other Ambulatory Visit: Payer: Self-pay

## 2018-08-09 ENCOUNTER — Encounter: Payer: Self-pay | Admitting: Emergency Medicine

## 2018-08-09 ENCOUNTER — Emergency Department
Admission: EM | Admit: 2018-08-09 | Discharge: 2018-08-09 | Disposition: A | Discharge status: Home / Self Care | Payer: Self-pay | Attending: Emergency Medicine | Admitting: Emergency Medicine

## 2018-08-09 ENCOUNTER — Emergency Department: Payer: Self-pay

## 2018-08-09 DIAGNOSIS — Z5321 Procedure and treatment not carried out due to patient leaving prior to being seen by health care provider: Secondary | ICD-10-CM | POA: Insufficient documentation

## 2018-08-09 DIAGNOSIS — R1110 Vomiting, unspecified: Secondary | ICD-10-CM | POA: Insufficient documentation

## 2018-08-09 LAB — CBC
HCT: 45.3 % (ref 39.0–52.0)
Hemoglobin: 15.2 g/dL (ref 13.0–17.0)
MCH: 29 pg (ref 26.0–34.0)
MCHC: 33.6 g/dL (ref 30.0–36.0)
MCV: 86.3 fL (ref 80.0–100.0)
NRBC: 0 % (ref 0.0–0.2)
PLATELETS: 232 10*3/uL (ref 150–400)
RBC: 5.25 MIL/uL (ref 4.22–5.81)
RDW: 12.7 % (ref 11.5–15.5)
WBC: 10 10*3/uL (ref 4.0–10.5)

## 2018-08-09 LAB — URINALYSIS, COMPLETE (UACMP) WITH MICROSCOPIC
Bacteria, UA: NONE SEEN
Bilirubin Urine: NEGATIVE
Ketones, ur: NEGATIVE mg/dL
Nitrite: NEGATIVE
Protein, ur: 100 mg/dL — AB
SPECIFIC GRAVITY, URINE: 1.024 (ref 1.005–1.030)
pH: 6 (ref 5.0–8.0)

## 2018-08-09 LAB — LIPASE, BLOOD: Lipase: 42 U/L (ref 11–51)

## 2018-08-09 LAB — TROPONIN I

## 2018-08-09 LAB — GLUCOSE, CAPILLARY: GLUCOSE-CAPILLARY: 413 mg/dL — AB (ref 70–99)

## 2018-08-09 MED ORDER — SODIUM CHLORIDE 0.9 % IV BOLUS
1000.0000 mL | Freq: Once | INTRAVENOUS | Status: AC
  Administered 2018-08-09: 1000 mL via INTRAVENOUS

## 2018-08-09 NOTE — ED Triage Notes (Signed)
Has had some vomiting over last couple days per pt.  Sugar over 300 last night.  C/o chest pain described as burning that started after the vomiting.  VSS. NAD. Ambulatory to triage.  No diarrhea or fevers. C/o increased gas.

## 2018-08-09 NOTE — ED Notes (Signed)
Pt requesting to sign out. IV is being taken out and pt is leaving with mother.

## 2018-08-17 ENCOUNTER — Emergency Department: Payer: Self-pay

## 2018-08-17 ENCOUNTER — Emergency Department
Admission: EM | Admit: 2018-08-17 | Discharge: 2018-08-18 | Disposition: A | Discharge status: Home / Self Care | Payer: Self-pay | Attending: Student in an Organized Health Care Education/Training Program | Admitting: Student in an Organized Health Care Education/Training Program

## 2018-08-17 ENCOUNTER — Other Ambulatory Visit: Payer: Self-pay

## 2018-08-17 ENCOUNTER — Encounter: Payer: Self-pay | Admitting: Emergency Medicine

## 2018-08-17 DIAGNOSIS — E119 Type 2 diabetes mellitus without complications: Secondary | ICD-10-CM | POA: Insufficient documentation

## 2018-08-17 DIAGNOSIS — R1013 Epigastric pain: Secondary | ICD-10-CM | POA: Insufficient documentation

## 2018-08-17 DIAGNOSIS — Z7984 Long term (current) use of oral hypoglycemic drugs: Secondary | ICD-10-CM | POA: Insufficient documentation

## 2018-08-17 DIAGNOSIS — F17210 Nicotine dependence, cigarettes, uncomplicated: Secondary | ICD-10-CM | POA: Insufficient documentation

## 2018-08-17 DIAGNOSIS — Z79899 Other long term (current) drug therapy: Secondary | ICD-10-CM | POA: Insufficient documentation

## 2018-08-17 DIAGNOSIS — R1011 Right upper quadrant pain: Secondary | ICD-10-CM | POA: Insufficient documentation

## 2018-08-17 LAB — TROPONIN I: Troponin I: <0.03 ng/mL (ref <0.03)

## 2018-08-17 LAB — URINALYSIS, COMPLETE (UACMP) WITH MICROSCOPIC
Bacteria, UA: NONE SEEN
Bilirubin Urine: NEGATIVE
KETONES UR: NEGATIVE mg/dL
NITRITE: NEGATIVE
PH: 5 (ref 5.0–8.0)
Protein, ur: 100 mg/dL — AB
Specific Gravity, Urine: 1.028 (ref 1.005–1.030)

## 2018-08-17 LAB — CBC
HEMATOCRIT: 46 % (ref 39.0–52.0)
HEMOGLOBIN: 15.3 g/dL (ref 13.0–17.0)
MCH: 29.1 pg (ref 26.0–34.0)
MCHC: 33.3 g/dL (ref 30.0–36.0)
MCV: 87.6 fL (ref 80.0–100.0)
NRBC: 0 % (ref 0.0–0.2)
Platelets: 258 10*3/uL (ref 150–400)
RBC: 5.25 MIL/uL (ref 4.22–5.81)
RDW: 12.7 % (ref 11.5–15.5)
WBC: 12.4 10*3/uL — AB (ref 4.0–10.5)

## 2018-08-17 LAB — COMPREHENSIVE METABOLIC PANEL
ALT: 47 U/L — AB (ref 0–44)
AST: 28 U/L (ref 15–41)
Albumin: 3.7 g/dL (ref 3.5–5.0)
Alkaline Phosphatase: 91 U/L (ref 38–126)
Anion gap: 9 (ref 5–15)
BUN: 22 mg/dL — ABNORMAL HIGH (ref 6–20)
CHLORIDE: 102 mmol/L (ref 98–111)
CO2: 23 mmol/L (ref 22–32)
CREATININE: 1.19 mg/dL (ref 0.61–1.24)
Calcium: 9.7 mg/dL (ref 8.9–10.3)
Glucose, Bld: 344 mg/dL — ABNORMAL HIGH (ref 70–99)
POTASSIUM: 4.2 mmol/L (ref 3.5–5.1)
Sodium: 134 mmol/L — ABNORMAL LOW (ref 135–145)
Total Bilirubin: 0.6 mg/dL (ref 0.3–1.2)
Total Protein: 7.1 g/dL (ref 6.5–8.1)

## 2018-08-17 LAB — LIPASE, BLOOD: LIPASE: 49 U/L (ref 11–51)

## 2018-08-17 MED ORDER — HYDROCODONE-ACETAMINOPHEN 5-325 MG PO TABS: 1.0000 | ORAL_TABLET | ORAL | 0 refills | Status: DC | PRN

## 2018-08-17 MED ORDER — SUCRALFATE 1 G PO TABS
1.0000 g | ORAL_TABLET | Freq: Once | ORAL | Status: AC
  Administered 2018-08-17: 1 g via ORAL
  Filled 2018-08-17: qty 1

## 2018-08-17 MED ORDER — PROMETHAZINE HCL 25 MG/ML IJ SOLN
12.5000 mg | Freq: Four times a day (QID) | INTRAMUSCULAR | Status: DC | PRN
  Administered 2018-08-17: 12.5 mg via INTRAVENOUS
  Filled 2018-08-17: qty 1

## 2018-08-17 MED ORDER — SODIUM CHLORIDE 0.9 % IV BOLUS
1000.0000 mL | Freq: Once | INTRAVENOUS | Status: AC
  Administered 2018-08-17: 1000 mL via INTRAVENOUS

## 2018-08-17 MED ORDER — PANTOPRAZOLE SODIUM 40 MG PO TBEC: 40.0000 mg | DELAYED_RELEASE_TABLET | Freq: Every day | ORAL | 1 refills | Status: DC

## 2018-08-17 MED ORDER — PROCHLORPERAZINE MALEATE 10 MG PO TABS: 10.0000 mg | ORAL_TABLET | Freq: Four times a day (QID) | ORAL | 0 refills | Status: DC | PRN

## 2018-08-17 MED ORDER — MORPHINE SULFATE (PF) 4 MG/ML IV SOLN
4.0000 mg | INTRAVENOUS | Status: DC | PRN
  Administered 2018-08-17: 4 mg via INTRAVENOUS
  Filled 2018-08-17: qty 1

## 2018-08-17 NOTE — ED Triage Notes (Signed)
Upper abdominal pain around to back with nausea and vomiting x 3 days.

## 2018-08-17 NOTE — Discharge Instructions (Signed)

## 2018-08-17 NOTE — ED Notes (Addendum)
Abdominal pain started 3-4 days ago. He reports it starts in the back on right and shoots towards the front over to the left side. Pain worsens with eating and drinking. He says started when he began his Metformin 3-4 days. Pt states he took two Advil around 4 pm today and did not ease pain.

## 2018-08-17 NOTE — ED Provider Notes (Signed)
[facility] Emergency Department Provider Note    First MD Initiated Contact with Patient 08/17/18 2056     (approximate)  I have reviewed the triage vital signs and the nursing notes.   HISTORY  Chief Complaint Abdominal Pain    HPI Jon Hoover is a 42 y.o. male history of diabetes presents the ER with 3 to 4 days of worsening epigastric right upper quadrant pain.  States it started in his right back and radiating to the right upper quadrant area now.  No shoulder pain.  Is having nausea and vomiting with it.  Thinks it is related to starting metformin.  Denies any fevers.  States is worse after eating and drinking.  Denies any lower abdominal pain.    Past Medical History:  Diagnosis Date  . Diabetes mellitus without complication (HCC)    No family history on file. History reviewed. No pertinent surgical history. There are no active problems to display for this patient.     Prior to Admission medications   Medication Sig Start Date End Date Taking? Authorizing Provider  famotidine (PEPCID) 20 MG tablet Take 1 tablet (20 mg total) by mouth 2 (two) times daily. 02/26/18   Irean Hong, MD  glipiZIDE (GLUCOTROL) 5 MG tablet Take 1 tablet (5 mg total) by mouth 2 (two) times daily. 01/29/18 01/29/19  Emily Filbert, MD  HYDROcodone-acetaminophen (NORCO) 5-325 MG tablet Take 1 tablet by mouth every 4 (four) hours as needed for moderate pain. 08/17/18   Willy Eddy, MD  meloxicam (MOBIC) 15 MG tablet Take 1 tablet (15 mg total) by mouth daily. Patient not taking: Reported on 01/08/2018 12/17/17   Kem Boroughs B, FNP  metFORMIN (GLUCOPHAGE) 1000 MG tablet Take 1,000 mg by mouth 2 (two) times daily. 01/01/18 01/31/18  [provider]  ondansetron (ZOFRAN ODT) 4 MG disintegrating tablet Take 1 tablet (4 mg total) by mouth every 8 (eight) hours as needed for nausea or vomiting. 02/26/18   Irean Hong, MD  ondansetron (ZOFRAN) 4 MG tablet Take 1  tablet (4 mg total) by mouth every 8 (eight) hours as needed for nausea or vomiting. 02/09/18   Phineas Semen, MD  pantoprazole (PROTONIX) 40 MG tablet Take 1 tablet (40 mg total) by mouth daily. 08/17/18 08/17/19  Willy Eddy, MD  prochlorperazine (COMPAZINE) 10 MG tablet Take 1 tablet (10 mg total) by mouth every 6 (six) hours as needed for nausea or vomiting. 08/17/18   Willy Eddy, MD  sucralfate (CARAFATE) 1 GM/10ML suspension Take 10 mLs (1 g total) by mouth 4 (four) times daily. 02/26/18   Irean Hong, MD    Allergies Patient has no known allergies.    Social History Social History   Tobacco Use  . Smoking status: Current Every Day Smoker    Types: Cigarettes  . Smokeless tobacco: Never Used  Substance Use Topics  . Alcohol use: Yes    Comment: occ  . Drug use: No    Review of Systems Patient denies headaches, rhinorrhea, blurry vision, numbness, shortness of breath, chest pain, edema, cough, abdominal pain, nausea, vomiting, diarrhea, dysuria, fevers, rashes or hallucinations unless otherwise stated above in HPI. ____________________________________________   PHYSICAL EXAM:  VITAL SIGNS: Vitals:   08/17/18 2058 08/17/18 2130  BP: (!) 143/95 (!) 140/93  Pulse: 93 94  Resp: 18   Temp:    SpO2: 99% 98%    Constitutional: Alert and oriented.  Eyes: Conjunctivae are normal.  Head: Atraumatic. Nose:  No congestion/rhinnorhea. Mouth/Throat: Mucous membranes are moist.   Neck: No stridor. Painless ROM.  Cardiovascular: Normal rate, regular rhythm. Grossly normal heart sounds.  Good peripheral circulation. Respiratory: Normal respiratory effort.  No retractions. Lungs CTAB. Gastrointestinal: Soft, obese with mild right upper quadrant abdominal pain.. No distention. No abdominal bruits. No CVA tenderness. Genitourinary:  Musculoskeletal: No lower extremity tenderness nor edema.  No joint effusions. Neurologic:  Normal speech and language. No gross focal  neurologic deficits are appreciated. No facial droop Skin:  Skin is warm, dry and intact. No rash noted. Psychiatric: Mood and affect are normal. Speech and behavior are normal.  ____________________________________________   LABS (all labs ordered are listed, but only abnormal results are displayed)  Results for orders placed or performed during the hospital encounter of 08/17/18 (from the past 24 hour(s))  Lipase, blood     Status: None   Collection Time: 08/17/18  5:32 PM  Result Value Ref Range   Lipase 49 11 - 51 U/L  Comprehensive metabolic panel     Status: Abnormal   Collection Time: 08/17/18  5:32 PM  Result Value Ref Range   Sodium 134 (L) 135 - 145 mmol/L   Potassium 4.2 3.5 - 5.1 mmol/L   Chloride 102 98 - 111 mmol/L   CO2 23 22 - 32 mmol/L   Glucose, Bld 344 (H) 70 - 99 mg/dL   BUN 22 (H) 6 - 20 mg/dL   Creatinine, Ser 1.61 0.61 - 1.24 mg/dL   Calcium 9.7 8.9 - 09.6 mg/dL   Total Protein 7.1 6.5 - 8.1 g/dL   Albumin 3.7 3.5 - 5.0 g/dL   AST 28 15 - 41 U/L   ALT 47 (H) 0 - 44 U/L   Alkaline Phosphatase 91 38 - 126 U/L   Total Bilirubin 0.6 0.3 - 1.2 mg/dL   GFR calc non Af Amer >60 >60 mL/min   GFR calc Af Amer >60 >60 mL/min   Anion gap 9 5 - 15  CBC     Status: Abnormal   Collection Time: 08/17/18  5:32 PM  Result Value Ref Range   WBC 12.4 (H) 4.0 - 10.5 K/uL   RBC 5.25 4.22 - 5.81 MIL/uL   Hemoglobin 15.3 13.0 - 17.0 g/dL   HCT 04.5 40.9 - 81.1 %   MCV 87.6 80.0 - 100.0 fL   MCH 29.1 26.0 - 34.0 pg   MCHC 33.3 30.0 - 36.0 g/dL   RDW 91.4 78.2 - 95.6 %   Platelets 258 150 - 400 K/uL   nRBC 0.0 0.0 - 0.2 %  Urinalysis, Complete w Microscopic     Status: Abnormal   Collection Time: 08/17/18  5:32 PM  Result Value Ref Range   Color, Urine YELLOW (A) YELLOW   APPearance CLEAR (A) CLEAR   Specific Gravity, Urine 1.028 1.005 - 1.030   pH 5.0 5.0 - 8.0   Glucose, UA >=500 (A) NEGATIVE mg/dL   Hgb urine dipstick SMALL (A) NEGATIVE   Bilirubin Urine  NEGATIVE NEGATIVE   Ketones, ur NEGATIVE NEGATIVE mg/dL   Protein, ur 213 (A) NEGATIVE mg/dL   Nitrite NEGATIVE NEGATIVE   Leukocytes, UA TRACE (A) NEGATIVE   RBC / HPF 0-5 0 - 5 RBC/hpf   WBC, UA 11-20 0 - 5 WBC/hpf   Bacteria, UA NONE SEEN NONE SEEN   Squamous Epithelial / LPF 0-5 0 - 5  Troponin I     Status: None   Collection Time: 08/17/18  5:32  PM  Result Value Ref Range   Troponin I <0.03 <0.03 ng/mL   ____________________________________________  EKG My review and personal interpretation at Time: 17:34   Indication: epigastric pain  Rate: 100  Rhythm: sinus Axis: normal Other: normal intervals, no stemi ____________________________________________  RADIOLOGY  I personally reviewed all radiographic images ordered to evaluate for the above acute complaints and reviewed radiology reports and findings.  These findings were personally discussed with the patient.  Please see medical record for radiology report.  ____________________________________________   PROCEDURES  Procedure(s) performed:  Procedures    Critical Care performed: no ____________________________________________   INITIAL IMPRESSION / ASSESSMENT AND PLAN / ED COURSE  Pertinent labs & imaging results that were available during my care of the patient were reviewed by me and considered in my medical decision making (see chart for details).   DDX: enteritis, diverticulitis, cholelithiasis, cholecystitis, pancreatitis  Jeri Bains is a 42 y.o. who presents to the ED with for evaluation of several days of epigastric pain as described above.  Patient afebrile.  Given his location of pain will give IV fluids as well as IV pain medication and will order right upper quadrant ultrasound.  We will give IV fluids.  Clinical Course as of Aug 17 2352  Mon Aug 17, 2018  2250 Patient still with some right flank pain.  Right upper quadrant ultrasound shows some hepatic steatosis without stones.  Denies any dysuria  but patient still with significant pain.  Will give additional IV pain medication as well as order CT renal.  Patient will be signed out to oncoming physician.   [PR]  2350 With patient CT imaging shows evidence of possible early duodenitis or pancreatitis.  Patient's pain is currently controlled.  Instruct patient to stop metformin follow-up with PCP regarding elevated glucose.   [PR]    Clinical Course User Index [PR] Willy Eddy, MD     As part of my medical decision making, I reviewed the following data within the electronic MEDICAL RECORD NUMBER Nursing notes reviewed and incorporated, Labs reviewed, notes from prior ED visits and [place] Controlled Substance Database   ____________________________________________   FINAL CLINICAL IMPRESSION(S) / ED DIAGNOSES  Final diagnoses:  RUQ pain  Epigastric pain      NEW MEDICATIONS STARTED DURING THIS VISIT:  New Prescriptions   HYDROCODONE-ACETAMINOPHEN (NORCO) 5-325 MG TABLET    Take 1 tablet by mouth every 4 (four) hours as needed for moderate pain.   PANTOPRAZOLE (PROTONIX) 40 MG TABLET    Take 1 tablet (40 mg total) by mouth daily.   PROCHLORPERAZINE (COMPAZINE) 10 MG TABLET    Take 1 tablet (10 mg total) by mouth every 6 (six) hours as needed for nausea or vomiting.     Note:  This document was prepared using Dragon voice recognition software and may include unintentional dictation errors.    Willy Eddy, MD 08/17/18 [phone]

## 2018-08-18 NOTE — ED Notes (Signed)
PO challenged pt.

## 2018-11-14 ENCOUNTER — Other Ambulatory Visit: Payer: Self-pay

## 2018-11-14 ENCOUNTER — Encounter: Payer: Self-pay | Admitting: Emergency Medicine

## 2018-11-14 ENCOUNTER — Emergency Department
Admission: EM | Admit: 2018-11-14 | Discharge: 2018-11-14 | Disposition: A | Discharge status: Home / Self Care | Payer: Self-pay | Attending: Emergency Medicine | Admitting: Emergency Medicine

## 2018-11-14 DIAGNOSIS — R110 Nausea: Secondary | ICD-10-CM

## 2018-11-14 DIAGNOSIS — F17210 Nicotine dependence, cigarettes, uncomplicated: Secondary | ICD-10-CM | POA: Insufficient documentation

## 2018-11-14 DIAGNOSIS — E860 Dehydration: Secondary | ICD-10-CM | POA: Insufficient documentation

## 2018-11-14 DIAGNOSIS — Z79899 Other long term (current) drug therapy: Secondary | ICD-10-CM | POA: Insufficient documentation

## 2018-11-14 DIAGNOSIS — Z7984 Long term (current) use of oral hypoglycemic drugs: Secondary | ICD-10-CM | POA: Insufficient documentation

## 2018-11-14 DIAGNOSIS — E119 Type 2 diabetes mellitus without complications: Secondary | ICD-10-CM | POA: Insufficient documentation

## 2018-11-14 LAB — CBC
HEMATOCRIT: 45.5 % (ref 39.0–52.0)
HEMOGLOBIN: 15.4 g/dL (ref 13.0–17.0)
MCH: 29 pg (ref 26.0–34.0)
MCHC: 33.8 g/dL (ref 30.0–36.0)
MCV: 85.7 fL (ref 80.0–100.0)
Platelets: 211 10*3/uL (ref 150–400)
RBC: 5.31 MIL/uL (ref 4.22–5.81)
RDW: 13.1 % (ref 11.5–15.5)
WBC: 6.4 10*3/uL (ref 4.0–10.5)
nRBC: 0 % (ref 0.0–0.2)

## 2018-11-14 LAB — URINALYSIS, COMPLETE (UACMP) WITH MICROSCOPIC
Bacteria, UA: NONE SEEN
Bilirubin Urine: NEGATIVE
Glucose, UA: >=500 mg/dL — AB
Ketones, ur: NEGATIVE mg/dL
Leukocytes, UA: NEGATIVE
Nitrite: NEGATIVE
Protein, ur: >=300 mg/dL — AB
Specific Gravity, Urine: 1.025 (ref 1.005–1.030)
pH: 5 (ref 5.0–8.0)

## 2018-11-14 LAB — COMPREHENSIVE METABOLIC PANEL
ALBUMIN: 3.5 g/dL (ref 3.5–5.0)
ALT: 58 U/L — ABNORMAL HIGH (ref 0–44)
AST: 48 U/L — AB (ref 15–41)
Alkaline Phosphatase: 88 U/L (ref 38–126)
Anion gap: 8 (ref 5–15)
BUN: 13 mg/dL (ref 6–20)
CHLORIDE: 100 mmol/L (ref 98–111)
CO2: 23 mmol/L (ref 22–32)
Calcium: 9.1 mg/dL (ref 8.9–10.3)
Creatinine, Ser: 1.08 mg/dL (ref 0.61–1.24)
GFR calc Af Amer: >60 mL/min (ref >60)
GFR calc non Af Amer: >60 mL/min (ref >60)
GLUCOSE: 251 mg/dL — AB (ref 70–99)
POTASSIUM: 4.1 mmol/L (ref 3.5–5.1)
Sodium: 131 mmol/L — ABNORMAL LOW (ref 135–145)
Total Bilirubin: 0.8 mg/dL (ref 0.3–1.2)
Total Protein: 7.5 g/dL (ref 6.5–8.1)

## 2018-11-14 LAB — GLUCOSE, CAPILLARY: GLUCOSE-CAPILLARY: 237 mg/dL — AB (ref 70–99)

## 2018-11-14 LAB — LIPASE, BLOOD: Lipase: 33 U/L (ref 11–51)

## 2018-11-14 MED ORDER — SODIUM CHLORIDE 0.9% FLUSH
3.0000 mL | Freq: Once | INTRAVENOUS | Status: AC
  Administered 2018-11-14: 3 mL via INTRAVENOUS

## 2018-11-14 MED ORDER — SODIUM CHLORIDE 0.9 % IV BOLUS
500.0000 mL | Freq: Once | INTRAVENOUS | Status: AC
  Administered 2018-11-14: 500 mL via INTRAVENOUS

## 2018-11-14 NOTE — ED Triage Notes (Signed)
Pt arrives POV and ambulatory to triage with c/o N/V/D x 2 days. Pt is diabetic and states that his last blood sugar was "2 something". Pt appears in NAD at this time in triage.

## 2018-11-14 NOTE — ED Provider Notes (Signed)
[facility] Emergency Department Provider Note   ____________________________________________   First MD Initiated Contact with Patient 11/14/18 [phone]     (approximate)  I have reviewed the triage vital signs and the nursing notes.   HISTORY  Chief Complaint Emesis    HPI Jon Hoover is a 43 y.o. male here for evaluation of nausea and slightly loose stool.  Patient reports that for few days now has not felt well.  [place] [facility], and he reports that he was treated there for similar symptoms to get his blood sugar was up around 400.  He reports he takes glipizide twice daily, and he is now using Lantus 25 units each night.  He continues to feel occasional nausea.  Reports he vomited once yesterday, but is been able to eat and drink okay since that time.  He not having any abdominal pain.  No chest pain.  No trouble breathing.  He reports he just has not felt too well.  A little bit lightheaded especially when he is up walking around at work.  Otherwise he feels well, but had to come here instead of work because he is continued to feel occasional lightheadedness.   Past Medical History:  Diagnosis Date  . Diabetes mellitus without complication (HCC)     There are no active problems to display for this patient.   History reviewed. No pertinent surgical history.  Prior to Admission medications   Medication Sig Start Date End Date Taking? Authorizing Provider  famotidine (PEPCID) 20 MG tablet Take 1 tablet (20 mg total) by mouth 2 (two) times daily. 02/26/18   Irean HongSung, Jade J, MD  glipiZIDE (GLUCOTROL) 5 MG tablet Take 1 tablet (5 mg total) by mouth 2 (two) times daily. 01/29/18 01/29/19  [name], [name], MD  HYDROcodone-acetaminophen (NORCO) 5-325 MG tablet Take 1 tablet by mouth every 4 (four) hours as needed for moderate pain. 08/17/18   Willy Eddyobinson, Patrick, MD  meloxicam (MOBIC) 15 MG tablet Take 1 tablet (15 mg total) by mouth  daily. Patient not taking: Reported on 01/08/2018 12/17/17   Kem Boroughsriplett, Cari B, FNP  metFORMIN (GLUCOPHAGE) 1000 MG tablet Take 1,000 mg by mouth 2 (two) times daily. 01/01/18 01/31/18  [provider]  ondansetron (ZOFRAN ODT) 4 MG disintegrating tablet Take 1 tablet (4 mg total) by mouth every 8 (eight) hours as needed for nausea or vomiting. 02/26/18   Irean HongSung, Jade J, MD  ondansetron (ZOFRAN) 4 MG tablet Take 1 tablet (4 mg total) by mouth every 8 (eight) hours as needed for nausea or vomiting. 02/09/18   Phineas SemenGoodman, Graydon, MD  pantoprazole (PROTONIX) 40 MG tablet Take 1 tablet (40 mg total) by mouth daily. 08/17/18 08/17/19  Willy Eddyobinson, Patrick, MD  prochlorperazine (COMPAZINE) 10 MG tablet Take 1 tablet (10 mg total) by mouth every 6 (six) hours as needed for nausea or vomiting. 08/17/18   Willy Eddyobinson, Patrick, MD  sucralfate (CARAFATE) 1 GM/10ML suspension Take 10 mLs (1 g total) by mouth 4 (four) times daily. 02/26/18   Irean HongSung, Jade J, MD    Allergies Patient has no known allergies.  No family history on file.  Social History Social History   Tobacco Use  . Smoking status: Current Every Day Smoker    Types: Cigarettes  . Smokeless tobacco: Never Used  Substance Use Topics  . Alcohol use: Yes    Comment: occ  . Drug use: No    Review of Systems Constitutional: No fever/chills  but feels lightheaded off and on especially when walking or standing Eyes: No visual changes. ENT: No sore throat. Cardiovascular: Denies chest pain. Respiratory: Denies shortness of breath. Gastrointestinal: No abdominal pain.  Some nausea and a slight couple of looser stools that are nonblack nonbloody.  Vomited once yesterday but feels better today eating well. Genitourinary: Negative for dysuria. Musculoskeletal: Negative for back pain. Skin: Negative for rash. Neurological: Negative for headaches, areas of focal weakness or numbness.    ____________________________________________   PHYSICAL  EXAM:  VITAL SIGNS: ED Triage Vitals  Enc Vitals Group     BP 11/14/18 0649 (!) 149/93     Pulse Rate 11/14/18 0649 (!) 102     Resp 11/14/18 0649 20     Temp 11/14/18 0649 98.3 F (36.8 C)     Temp Source 11/14/18 0649 Oral     SpO2 11/14/18 0649 98 %     Weight 11/14/18 0645 280 lb (127 kg)     Height 11/14/18 0645 5\' 9"  (1.753 m)     Head Circumference --      Peak Flow --      Pain Score 11/14/18 0645 7     Pain Loc --      Pain Edu? --      Excl. in GC? --     Constitutional: Alert and oriented. Well appearing and in no acute distress. Eyes: Conjunctivae are normal. Head: Atraumatic. Nose: No congestion/rhinnorhea. Mouth/Throat: Mucous membranes are moist. Neck: No stridor.  Cardiovascular: Normal rate, regular rhythm. Grossly normal heart sounds.  Good peripheral circulation. Respiratory: Normal respiratory effort.  No retractions. Lungs CTAB. Gastrointestinal: Soft and nontender. No distention.  No rebound or guarding in any quadrant.  No peritonitis. Musculoskeletal: No lower extremity tenderness nor edema. Neurologic:  Normal speech and language. No gross focal neurologic deficits are appreciated.  Skin:  Skin is warm, dry and intact. No rash noted. Psychiatric: Mood and affect are normal. Speech and behavior are normal.  ____________________________________________   LABS (all labs ordered are listed, but only abnormal results are displayed)  Labs Reviewed  COMPREHENSIVE METABOLIC PANEL - Abnormal; Notable for the following components:      Result Value   Sodium 131 (*)    Glucose, Bld 251 (*)    AST 48 (*)    ALT 58 (*)    All other components within normal limits  URINALYSIS, COMPLETE (UACMP) WITH MICROSCOPIC - Abnormal; Notable for the following components:   Color, Urine YELLOW (*)    APPearance CLEAR (*)    Glucose, UA >=500 (*)    Hgb urine dipstick SMALL (*)    Protein, ur >=300 (*)    All other components within normal limits  GLUCOSE,  CAPILLARY - Abnormal; Notable for the following components:   Glucose-Capillary 237 (*)    All other components within normal limits  LIPASE, BLOOD  CBC  CBG MONITORING, ED   ____________________________________________  EKG Reviewed at 8:30 AM Heart rate 95 QRS 90 QTc 440 Normal sinus rhythm, no evidence of acute ischemia.  Possible LVH ____________________________________________  RADIOLOGY  Clinically reassuring examination.  No evidence of peritonitis on exam.  No focal tenderness to examination.  Patient denies abdominal pain.  No noted cause for imaging.  Denies neurologic symptoms.  No headaches. ____________________________________________   PROCEDURES  Procedure(s) performed: None  Procedures  Critical Care performed: No  ____________________________________________   INITIAL IMPRESSION / ASSESSMENT AND PLAN / ED COURSE  Pertinent labs & imaging results that  were available during my care of the patient were reviewed by me and considered in my medical decision making (see chart for details).   Patient presents reports some nausea and lightheadedness, lightheadedness appears to be mostly when while working.  Very reassuring clinical examination at this time.  Recent evaluation for hyperglycemia, and I suspect he may be still slightly dehydrated.  He is alert well oriented without any neurologic cardiac or pulmonary symptoms.  Will check EKG, lab work performed is reassuring with improved blood sugar today.  Clinical Course as of Nov 15 843  Sat Nov 14, 2018  8329 Reports takes glipizide 5mg  BID and lantus 25 units QHS.    [MQ]    Clinical Course User Index [MQ] Sharyn Creamer, MD   ----------------------------------------- 8:45 AM on 11/14/2018 -----------------------------------------  Patient heart rate goes up just slightly with orthostatics.  Resting comfortably, reports he feels well.  No ongoing nausea or symptoms at this time.  I suspect he likely had  some mild dehydration causing ongoing symptoms after hyperglycemia over the last few days where he was seen at [facility].  He appears well, denies any pain or complaint at present.  We will discharge the patient to home.  Careful return precautions advised.  Return precautions and treatment recommendations and follow-up discussed with the patient who is agreeable with the plan.   ____________________________________________   FINAL CLINICAL IMPRESSION(S) / ED DIAGNOSES  Final diagnoses:  Dehydration  Nausea        Note:  This document was prepared using Dragon voice recognition software and may include unintentional dictation errors       Sharyn Creamer, MD 11/14/18 0845

## 2018-12-19 ENCOUNTER — Emergency Department: Payer: Self-pay

## 2018-12-19 ENCOUNTER — Emergency Department
Admission: EM | Admit: 2018-12-19 | Discharge: 2018-12-19 | Disposition: A | Discharge status: Home / Self Care | Payer: Self-pay | Attending: Student in an Organized Health Care Education/Training Program | Admitting: Student in an Organized Health Care Education/Training Program

## 2018-12-19 ENCOUNTER — Other Ambulatory Visit: Payer: Self-pay

## 2018-12-19 ENCOUNTER — Encounter: Payer: Self-pay | Admitting: Emergency Medicine

## 2018-12-19 DIAGNOSIS — R05 Cough: Secondary | ICD-10-CM

## 2018-12-19 DIAGNOSIS — F17210 Nicotine dependence, cigarettes, uncomplicated: Secondary | ICD-10-CM | POA: Insufficient documentation

## 2018-12-19 DIAGNOSIS — Z794 Long term (current) use of insulin: Secondary | ICD-10-CM | POA: Insufficient documentation

## 2018-12-19 DIAGNOSIS — J111 Influenza due to unidentified influenza virus with other respiratory manifestations: Secondary | ICD-10-CM | POA: Insufficient documentation

## 2018-12-19 DIAGNOSIS — E119 Type 2 diabetes mellitus without complications: Secondary | ICD-10-CM | POA: Insufficient documentation

## 2018-12-19 DIAGNOSIS — R509 Fever, unspecified: Secondary | ICD-10-CM | POA: Insufficient documentation

## 2018-12-19 DIAGNOSIS — R059 Cough, unspecified: Secondary | ICD-10-CM

## 2018-12-19 DIAGNOSIS — R2242 Localized swelling, mass and lump, left lower limb: Secondary | ICD-10-CM | POA: Insufficient documentation

## 2018-12-19 DIAGNOSIS — R079 Chest pain, unspecified: Secondary | ICD-10-CM | POA: Insufficient documentation

## 2018-12-19 LAB — BASIC METABOLIC PANEL
Anion gap: 8 (ref 5–15)
BUN: 19 mg/dL (ref 6–20)
CHLORIDE: 102 mmol/L (ref 98–111)
CO2: 22 mmol/L (ref 22–32)
CREATININE: 1.33 mg/dL — AB (ref 0.61–1.24)
Calcium: 9.4 mg/dL (ref 8.9–10.3)
GFR calc Af Amer: >60 mL/min (ref >60)
GFR calc non Af Amer: >60 mL/min (ref >60)
Glucose, Bld: 327 mg/dL — ABNORMAL HIGH (ref 70–99)
Potassium: 4.4 mmol/L (ref 3.5–5.1)
Sodium: 132 mmol/L — ABNORMAL LOW (ref 135–145)

## 2018-12-19 LAB — CBC
HEMATOCRIT: 43.3 % (ref 39.0–52.0)
Hemoglobin: 14.8 g/dL (ref 13.0–17.0)
MCH: 29.2 pg (ref 26.0–34.0)
MCHC: 34.2 g/dL (ref 30.0–36.0)
MCV: 85.4 fL (ref 80.0–100.0)
Platelets: 224 10*3/uL (ref 150–400)
RBC: 5.07 MIL/uL (ref 4.22–5.81)
RDW: 12.9 % (ref 11.5–15.5)
WBC: 10.2 10*3/uL (ref 4.0–10.5)
nRBC: 0 % (ref 0.0–0.2)

## 2018-12-19 LAB — TROPONIN I: Troponin I: <0.03 ng/mL (ref <0.03)

## 2018-12-19 LAB — FIBRIN DERIVATIVES D-DIMER (ARMC ONLY): Fibrin derivatives D-dimer (ARMC): 582.68 ng{FEU}/mL — ABNORMAL HIGH (ref 0.00–499.00)

## 2018-12-19 MED ORDER — IOHEXOL 350 MG/ML SOLN
75.0000 mL | Freq: Once | INTRAVENOUS | Status: AC | PRN
  Administered 2018-12-19: 75 mL via INTRAVENOUS

## 2018-12-19 MED ORDER — IPRATROPIUM-ALBUTEROL 0.5-2.5 (3) MG/3ML IN SOLN
3.0000 mL | Freq: Once | RESPIRATORY_TRACT | Status: AC
  Administered 2018-12-19: 3 mL via RESPIRATORY_TRACT
  Filled 2018-12-19: qty 3

## 2018-12-19 MED ORDER — AZITHROMYCIN 500 MG PO TABS: 500.0000 mg | ORAL_TABLET | Freq: Every day | ORAL | 0 refills | Status: AC

## 2018-12-19 MED ORDER — SODIUM CHLORIDE 0.9 % IV BOLUS
500.0000 mL | Freq: Once | INTRAVENOUS | Status: AC
  Administered 2018-12-19: 500 mL via INTRAVENOUS

## 2018-12-19 MED ORDER — ALBUTEROL SULFATE HFA 108 (90 BASE) MCG/ACT IN AERS: 2.0000 | INHALATION_SPRAY | Freq: Four times a day (QID) | RESPIRATORY_TRACT | 2 refills | Status: AC | PRN

## 2018-12-19 MED ORDER — PREDNISONE 20 MG PO TABS: 40.0000 mg | ORAL_TABLET | Freq: Every day | ORAL | 0 refills | Status: AC

## 2018-12-19 MED ORDER — PREDNISONE 20 MG PO TABS
60.0000 mg | ORAL_TABLET | Freq: Once | ORAL | Status: AC
  Administered 2018-12-19: 60 mg via ORAL
  Filled 2018-12-19: qty 3

## 2018-12-19 NOTE — ED Notes (Signed)
Pt transported to ultrasound by ultrasound tech

## 2018-12-19 NOTE — ED Provider Notes (Signed)
[facility] Emergency Department Provider Note    None    (approximate)  I have reviewed the triage vital signs and the nursing notes.   HISTORY  Chief Complaint Cough; Fever; Chest Pain; and Influenza    HPI Nekoda Goldwire is a 43 y.o. male below listed past medical history presents the ER for evaluation of cough shortness of breath fevers and chills and left leg swelling.  Patient was at work overnight last night was given some Vicks VapoRub which he states made his chest hurt and made him feel like he was about to pass out.  This is the primary reason for which she came to the ER today.  He denies any  chest pain at this time.  No nausea or vomiting.  Has not been on Tamiflu or any antibiotics.  Does have a history of bronchitis.   Past Medical History:  Diagnosis Date  . Diabetes mellitus without complication (HCC)    History reviewed. No pertinent family history. History reviewed. No pertinent surgical history. There are no active problems to display for this patient.     Prior to Admission medications   Medication Sig Start Date End Date Taking? Authorizing Provider  glipiZIDE (GLUCOTROL) 5 MG tablet Take 1 tablet (5 mg total) by mouth 2 (two) times daily. 01/29/18 01/29/19 Yes Emily Filbert, MD  insulin glargine (LANTUS) 100 UNIT/ML injection Inject 25 Units into the skin at bedtime.   Yes [provider]  albuterol (PROVENTIL HFA;VENTOLIN HFA) 108 (90 Base) MCG/ACT inhaler Inhale 2 puffs into the lungs every 6 (six) hours as needed for wheezing or shortness of breath. 12/19/18   Willy Eddy, MD  azithromycin (ZITHROMAX) 500 MG tablet Take 1 tablet (500 mg total) by mouth daily for 3 days. Take 1 tablet daily for 3 days. 12/19/18 12/22/18  Willy Eddy, MD  predniSONE (DELTASONE) 20 MG tablet Take 2 tablets (40 mg total) by mouth daily for 5 days. 12/19/18 12/24/18  Willy Eddy, MD    Allergies Patient has no known  allergies.    Social History Social History   Tobacco Use  . Smoking status: Current Every Day Smoker    Types: Cigarettes  . Smokeless tobacco: Never Used  Substance Use Topics  . Alcohol use: Yes    Comment: occ  . Drug use: No    Review of Systems Patient denies headaches, rhinorrhea, blurry vision, numbness, shortness of breath, chest pain, edema, cough, abdominal pain, nausea, vomiting, diarrhea, dysuria, fevers, rashes or hallucinations unless otherwise stated above in HPI. ____________________________________________   PHYSICAL EXAM:  VITAL SIGNS: Vitals:   12/19/18 0900 12/19/18 0930  BP: (!) 155/94 (!) 138/93  Pulse: (!) 110 (!) 113  Resp: 13 17  Temp:    SpO2: 99% 94%    Constitutional: Alert and oriented.  Eyes: Conjunctivae are normal.  Head: Atraumatic. Nose: No congestion/rhinnorhea. Mouth/Throat: Mucous membranes are moist.   Neck: No stridor. Painless ROM.  Cardiovascular: Normal rate, regular rhythm. Grossly normal heart sounds.  Good peripheral circulation. Respiratory: Normal respiratory effort.  No retractions. Lungs with coarse bibasilar breathsounds Gastrointestinal: Soft and nontender. No distention. No abdominal bruits. No CVA tenderness. Genitourinary:  Musculoskeletal: No lower extremity tenderness. 1+ LLE.  No joint effusions. Neurologic:  Normal speech and language. No gross focal neurologic deficits are appreciated. No facial droop Skin:  Skin is warm, dry and intact. No rash noted. Psychiatric: Mood and affect are normal. Speech and behavior are normal.  ____________________________________________  LABS (all labs ordered are listed, but only abnormal results are displayed)  Results for orders placed or performed during the hospital encounter of 12/19/18 (from the past 24 hour(s))  Basic metabolic panel     Status: Abnormal   Collection Time: 12/19/18  6:56 AM  Result Value Ref Range   Sodium 132 (L) 135 - 145 mmol/L    Potassium 4.4 3.5 - 5.1 mmol/L   Chloride 102 98 - 111 mmol/L   CO2 22 22 - 32 mmol/L   Glucose, Bld 327 (H) 70 - 99 mg/dL   BUN 19 6 - 20 mg/dL   Creatinine, Ser 1.61 (H) 0.61 - 1.24 mg/dL   Calcium 9.4 8.9 - 09.6 mg/dL   GFR calc non Af Amer >60 >60 mL/min   GFR calc Af Amer >60 >60 mL/min   Anion gap 8 5 - 15  CBC     Status: None   Collection Time: 12/19/18  6:56 AM  Result Value Ref Range   WBC 10.2 4.0 - 10.5 K/uL   RBC 5.07 4.22 - 5.81 MIL/uL   Hemoglobin 14.8 13.0 - 17.0 g/dL   HCT 04.5 40.9 - 81.1 %   MCV 85.4 80.0 - 100.0 fL   MCH 29.2 26.0 - 34.0 pg   MCHC 34.2 30.0 - 36.0 g/dL   RDW 91.4 78.2 - 95.6 %   Platelets 224 150 - 400 K/uL   nRBC 0.0 0.0 - 0.2 %  Troponin I - Add-On to previous collection     Status: None   Collection Time: 12/19/18  8:50 AM  Result Value Ref Range   Troponin I <0.03 <0.03 ng/mL  Fibrin derivatives D-Dimer ([facility] only)     Status: Abnormal   Collection Time: 12/19/18  8:50 AM  Result Value Ref Range   Fibrin derivatives D-dimer ([facility]) 582.68 (H) 0.00 - 499.00 ng/mL (FEU)   ____________________________________________  EKG My review and personal interpretation at Time: 6:50   Indication: chest pain  Rate: 105  Rhythm: sinus Axis: normal Other: normal intervals, no stemi ____________________________________________  RADIOLOGY  I personally reviewed all radiographic images ordered to evaluate for the above acute complaints and reviewed radiology reports and findings.  These findings were personally discussed with the patient.  Please see medical record for radiology report.  ____________________________________________   PROCEDURES  Procedure(s) performed:  Procedures    Critical Care performed: no ____________________________________________   INITIAL IMPRESSION / ASSESSMENT AND PLAN / ED COURSE  Pertinent labs & imaging results that were available during my care of the patient were reviewed by me and considered in my  medical decision making (see chart for details).   DDX: bronchitis, pna, chf,asthma, acs, msk strain  Aloys Hupfer is a 43 y.o. who presents to the ED with symptoms as described above.  Patient without any hypoxia but with mild tachycardia.  Blood will be sent for the by differential.  Patient is low risk by Wells criteria but given his leg swelling will order ultrasound as well as order d-dimer as PE is on the differential..  Will give duoneb.  The patient will be placed on continuous pulse oximetry and telemetry for monitoring.  Laboratory evaluation will be sent to evaluate for the above complaints.     Clinical Course as of Dec 19 1100  Sat Dec 19, 2018  2130 Given patient's tachycardia elevated d-dimer and leg swelling will order CT angiogram to exclude PE.  Troponin is negative.  Doubt ACS.   [PR]  1034 CT angiogram with no evidence of PE.  Given duration of symptoms this does not seem clinically consistent with ACS particular given troponin being negative.  Given there is a pattern suggesting air trapping do feel that Q bronchitis is most likely in this scenario.  We will continue with steroids as well as give prescription for inhaler.  We will also some course of azithromycin.   [PR]    Clinical Course User Index [PR] Willy Eddy, MD     As part of my medical decision making, I reviewed the following data within the electronic MEDICAL RECORD NUMBER Nursing notes reviewed and incorporated, Labs reviewed, notes from prior ED visits and [place] Controlled Substance Database   ____________________________________________   FINAL CLINICAL IMPRESSION(S) / ED DIAGNOSES  Final diagnoses:  Chest pain, unspecified type  Cough      NEW MEDICATIONS STARTED DURING THIS VISIT:  New Prescriptions   ALBUTEROL (PROVENTIL HFA;VENTOLIN HFA) 108 (90 BASE) MCG/ACT INHALER    Inhale 2 puffs into the lungs every 6 (six) hours as needed for wheezing or shortness of breath.   AZITHROMYCIN  (ZITHROMAX) 500 MG TABLET    Take 1 tablet (500 mg total) by mouth daily for 3 days. Take 1 tablet daily for 3 days.   PREDNISONE (DELTASONE) 20 MG TABLET    Take 2 tablets (40 mg total) by mouth daily for 5 days.     Note:  This document was prepared using Dragon voice recognition software and may include unintentional dictation errors.    Willy Eddy, MD 12/19/18 1102

## 2018-12-19 NOTE — ED Triage Notes (Signed)
Pt to triage ambulatory with no difficulty. Pt reports flu like sx for about 1 week. Today developed left side chest pain that radiates into left side of neck. +sob, denies n/v of diaphoresis. Hx of diabetes.

## 2018-12-19 NOTE — ED Notes (Signed)
Pt returned from ultrasound

## 2018-12-19 NOTE — ED Notes (Signed)
Pt off the floor in CT 

## 2018-12-19 NOTE — ED Notes (Signed)
Pt currently off the unit in x-ray

## 2019-03-31 ENCOUNTER — Other Ambulatory Visit: Payer: Self-pay

## 2019-03-31 ENCOUNTER — Emergency Department
Admission: EM | Admit: 2019-03-31 | Discharge: 2019-04-01 | Disposition: A | Discharge status: Home / Self Care | Payer: Self-pay | Attending: Emergency Medicine | Admitting: Emergency Medicine

## 2019-03-31 ENCOUNTER — Encounter: Payer: Self-pay | Admitting: Emergency Medicine

## 2019-03-31 DIAGNOSIS — Z794 Long term (current) use of insulin: Secondary | ICD-10-CM | POA: Insufficient documentation

## 2019-03-31 DIAGNOSIS — F17210 Nicotine dependence, cigarettes, uncomplicated: Secondary | ICD-10-CM | POA: Insufficient documentation

## 2019-03-31 DIAGNOSIS — K529 Noninfective gastroenteritis and colitis, unspecified: Secondary | ICD-10-CM | POA: Insufficient documentation

## 2019-03-31 DIAGNOSIS — E119 Type 2 diabetes mellitus without complications: Secondary | ICD-10-CM | POA: Insufficient documentation

## 2019-03-31 LAB — COMPREHENSIVE METABOLIC PANEL
ALT: 44 U/L (ref 0–44)
AST: 34 U/L (ref 15–41)
Albumin: 4.1 g/dL (ref 3.5–5.0)
Alkaline Phosphatase: 94 U/L (ref 38–126)
Anion gap: 10 (ref 5–15)
BUN: 14 mg/dL (ref 6–20)
CO2: 23 mmol/L (ref 22–32)
Calcium: 9.8 mg/dL (ref 8.9–10.3)
Chloride: 102 mmol/L (ref 98–111)
Creatinine, Ser: 1.09 mg/dL (ref 0.61–1.24)
GFR calc Af Amer: >60 mL/min (ref >60)
GFR calc non Af Amer: >60 mL/min (ref >60)
Glucose, Bld: 232 mg/dL — ABNORMAL HIGH (ref 70–99)
Potassium: 4.1 mmol/L (ref 3.5–5.1)
Sodium: 135 mmol/L (ref 135–145)
Total Bilirubin: 0.5 mg/dL (ref 0.3–1.2)
Total Protein: 8.1 g/dL (ref 6.5–8.1)

## 2019-03-31 LAB — CBC WITH DIFFERENTIAL/PLATELET
Abs Immature Granulocytes: 0.07 10*3/uL (ref 0.00–0.07)
Basophils Absolute: 0.1 10*3/uL (ref 0.0–0.1)
Basophils Relative: 1 %
Eosinophils Absolute: 0.2 10*3/uL (ref 0.0–0.5)
Eosinophils Relative: 2 %
HCT: 45.3 % (ref 39.0–52.0)
Hemoglobin: 15.7 g/dL (ref 13.0–17.0)
Immature Granulocytes: 1 %
Lymphocytes Relative: 16 %
Lymphs Abs: 2 10*3/uL (ref 0.7–4.0)
MCH: 29.5 pg (ref 26.0–34.0)
MCHC: 34.7 g/dL (ref 30.0–36.0)
MCV: 85.2 fL (ref 80.0–100.0)
Monocytes Absolute: 0.7 10*3/uL (ref 0.1–1.0)
Monocytes Relative: 6 %
Neutro Abs: 9.5 10*3/uL — ABNORMAL HIGH (ref 1.7–7.7)
Neutrophils Relative %: 74 %
Platelets: 260 10*3/uL (ref 150–400)
RBC: 5.32 MIL/uL (ref 4.22–5.81)
RDW: 13.1 % (ref 11.5–15.5)
WBC: 12.6 10*3/uL — ABNORMAL HIGH (ref 4.0–10.5)
nRBC: 0 % (ref 0.0–0.2)

## 2019-03-31 LAB — URINALYSIS, COMPLETE (UACMP) WITH MICROSCOPIC
Bacteria, UA: NONE SEEN
Bilirubin Urine: NEGATIVE
Glucose, UA: 50 mg/dL — AB
Ketones, ur: NEGATIVE mg/dL
Leukocytes,Ua: NEGATIVE
Nitrite: NEGATIVE
Protein, ur: >=300 mg/dL — AB
Specific Gravity, Urine: 1.013 (ref 1.005–1.030)
pH: 7 (ref 5.0–8.0)

## 2019-03-31 LAB — LIPASE, BLOOD: Lipase: 30 U/L (ref 11–51)

## 2019-03-31 MED ORDER — ONDANSETRON HCL 4 MG/2ML IJ SOLN
4.0000 mg | Freq: Once | INTRAMUSCULAR | Status: AC
  Administered 2019-04-01: 4 mg via INTRAVENOUS
  Filled 2019-03-31: qty 2

## 2019-03-31 MED ORDER — SODIUM CHLORIDE 0.9 % IV BOLUS
1000.0000 mL | Freq: Once | INTRAVENOUS | Status: AC
  Administered 2019-04-01: 1000 mL via INTRAVENOUS

## 2019-03-31 MED ORDER — FENTANYL CITRATE (PF) 100 MCG/2ML IJ SOLN
100.0000 ug | Freq: Once | INTRAMUSCULAR | Status: AC
  Administered 2019-04-01: 100 ug via INTRAVENOUS
  Filled 2019-03-31: qty 2

## 2019-03-31 MED ORDER — FAMOTIDINE IN NACL 20-0.9 MG/50ML-% IV SOLN
20.0000 mg | Freq: Once | INTRAVENOUS | Status: AC
  Administered 2019-04-01: 20 mg via INTRAVENOUS
  Filled 2019-03-31: qty 50

## 2019-03-31 NOTE — ED Provider Notes (Signed)
[facility] Emergency Department Provider Note  ____________________________________________  Time seen: Approximately 11:35 PM  I have reviewed the triage vital signs and the nursing notes.   HISTORY  Chief Complaint Emesis   HPI Jon Hoover is a 43 y.o. male with a history of diabetes who presents for evaluation of vomiting and diarrhea.  Patient reports 2 to 3 days of several daily episodes of diarrhea and vomiting.  Patient denies melena or hematochezia.  Reports that he has seen some specks of blood today when vomiting but none on the days prior.  He is complaining of diffuse burning abdominal pain which has been severe and constant.  He denies chest pain or shortness of breath, fever or chills, cough or congestion.  Past Medical History:  Diagnosis Date   Diabetes mellitus without complication ([place])     Prior to Admission medications   Medication Sig Start Date End Date Taking? Authorizing Provider  albuterol (PROVENTIL HFA;VENTOLIN HFA) 108 (90 Base) MCG/ACT inhaler Inhale 2 puffs into the lungs every 6 (six) hours as needed for wheezing or shortness of breath. 12/19/18   Merlyn Lot, MD  glipiZIDE (GLUCOTROL) 5 MG tablet Take 1 tablet (5 mg total) by mouth 2 (two) times daily. 01/29/18 01/29/19  Earleen Newport, MD  insulin glargine (LANTUS) 100 UNIT/ML injection Inject 25 Units into the skin at bedtime.    [provider]  ondansetron (ZOFRAN ODT) 4 MG disintegrating tablet Take 1 tablet (4 mg total) by mouth every 8 (eight) hours as needed. 04/01/19   Rudene Re, MD    Allergies Patient has no known allergies.  No family history on file.  Social History Social History   Tobacco Use   Smoking status: Current Every Day Smoker    Types: Cigarettes   Smokeless tobacco: Never Used  Substance Use Topics   Alcohol use: Yes    Comment: occ   Drug use: No    Review of Systems  Constitutional: Negative for  fever. Eyes: Negative for visual changes. ENT: Negative for sore throat. Neck: No neck pain  Cardiovascular: Negative for chest pain. Respiratory: Negative for shortness of breath. Gastrointestinal: + abdominal pain, vomiting and diarrhea. Genitourinary: Negative for dysuria. Musculoskeletal: Negative for back pain. Skin: Negative for rash. Neurological: Negative for headaches, weakness or numbness. Psych: No SI or HI  ____________________________________________   PHYSICAL EXAM:  VITAL SIGNS: ED Triage Vitals  Enc Vitals Group     BP 03/31/19 1943 (!) 163/100     Pulse Rate 03/31/19 1943 98     Resp 03/31/19 1943 20     Temp 03/31/19 1943 98.4 F (36.9 C)     Temp Source 03/31/19 1943 Oral     SpO2 03/31/19 1943 95 %     Weight 03/31/19 1942 290 lb (131.5 kg)     Height 03/31/19 1942 '5\' 8"'  (1.727 m)     Head Circumference --      Peak Flow --      Pain Score 03/31/19 1942 0     Pain Loc --      Pain Edu? --      Excl. in [place]? --     Constitutional: Alert and oriented, patient looks uncomfortable but in no distress. HEENT:      Head: Normocephalic and atraumatic.         Eyes: Conjunctivae are normal. Sclera is non-icteric.       Mouth/Throat: Mucous membranes are moist.  Neck: Supple with no signs of meningismus. Cardiovascular: Regular rate and rhythm. No murmurs, gallops, or rubs. 2+ symmetrical distal pulses are present in all extremities. No JVD. Respiratory: Normal respiratory effort. Lungs are clear to auscultation bilaterally. No wheezes, crackles, or rhonchi.  Gastrointestinal: Obese with diffuse tenderness to palpation, non distended with positive bowel sounds. No rebound or guarding. Musculoskeletal: Nontender with normal range of motion in all extremities. No edema, cyanosis, or erythema of extremities. Neurologic: Normal speech and language. Face is symmetric. Moving all extremities. No gross focal neurologic deficits are appreciated. Skin: Skin is  warm, dry and intact. No rash noted. Psychiatric: Mood and affect are normal. Speech and behavior are normal.  ____________________________________________   LABS (all labs ordered are listed, but only abnormal results are displayed)  Labs Reviewed  CBC WITH DIFFERENTIAL/PLATELET - Abnormal; Notable for the following components:      Result Value   WBC 12.6 (*)    Neutro Abs 9.5 (*)    All other components within normal limits  COMPREHENSIVE METABOLIC PANEL - Abnormal; Notable for the following components:   Glucose, Bld 232 (*)    All other components within normal limits  URINALYSIS, COMPLETE (UACMP) WITH MICROSCOPIC - Abnormal; Notable for the following components:   Color, Urine YELLOW (*)    APPearance CLEAR (*)    Glucose, UA 50 (*)    Hgb urine dipstick SMALL (*)    Protein, ur >=300 (*)    All other components within normal limits  LIPASE, BLOOD   ____________________________________________  EKG  ED ECG REPORT I, Rudene Re, the attending physician, personally viewed and interpreted this ECG.  Sinus tachycardia, rate of 100, normal intervals, normal axis, no ST elevations or depressions ____________________________________________  RADIOLOGY  none  ____________________________________________   PROCEDURES  Procedure(s) performed: None Procedures Critical Care performed:  None ____________________________________________   INITIAL IMPRESSION / ASSESSMENT AND PLAN / ED COURSE   43 y.o. male with a history of diabetes who presents for evaluation of few days of diffuse burning abdominal pain associated with several daily episodes of vomiting and diarrhea.  Patient looks uncomfortable, abdomen is obese, soft, diffusely tender to palpation with no rebound or guarding.  Labs showing mildly elevated white count of 12.6.  Glucose of 232 with normal electrolytes and no evidence of DKA.  Normal creatinine, normal LFTs, T bili, alk phos, and lipase.  UA  negative for UTI.  Differential diagnoses including viral gastroenteritis versus gastritis versus colitis.  With normal labs, normal vitals, and diffusely tender abdomen with no localize tenderness, rebound or guarding less likely appendicitis or gallbladder pathology.  We will treat with IV fluids, Zofran, fentanyl, Pepcid and reassess.    _________________________ 2:02 AM on 04/01/2019 -----------------------------------------  Patient feels markedly improved and no episodes of vomiting or diarrhea in the emergency room.  Pain has fully resolved.  Discussed standard return precautions for recurrence of abdominal pain, right lower quadrant abdominal pain, or fever.  Patient be discharged home and increase oral hydration and Zofran.  Recommended follow-up with PCP.   As part of my medical decision making, I reviewed the following data within the [place] notes reviewed and incorporated, Labs reviewed , EKG interpreted , Old chart reviewed, Notes from prior ED visits and [place] Controlled Substance Database    Pertinent labs & imaging results that were available during my care of the patient were reviewed by me and considered in my medical decision making (see chart for details).  ____________________________________________   FINAL CLINICAL IMPRESSION(S) / ED DIAGNOSES  Final diagnoses:  Gastroenteritis      NEW MEDICATIONS STARTED DURING THIS VISIT:  ED Discharge Orders         Ordered    ondansetron (ZOFRAN ODT) 4 MG disintegrating tablet  Every 8 hours PRN     04/01/19 0201           Note:  This document was prepared using Dragon voice recognition software and may include unintentional dictation errors.    Alfred Levins, [place], MD 04/01/19 [phone]

## 2019-03-31 NOTE — ED Notes (Signed)
Dr. Veronese at the bedside for pt evaluation.  

## 2019-03-31 NOTE — ED Triage Notes (Signed)
Pt to triage via w/c with no distress noted; pt N/V for few hours; denies pain

## 2019-04-01 MED ORDER — ONDANSETRON 4 MG PO TBDP
ORAL_TABLET | ORAL | Status: AC
  Administered 2019-04-01: 4 mg via ORAL
  Filled 2019-04-01: qty 1

## 2019-04-01 MED ORDER — ONDANSETRON HCL 4 MG/2ML IJ SOLN
4.0000 mg | Freq: Once | INTRAMUSCULAR | Status: AC
  Administered 2019-04-01: 4 mg via INTRAVENOUS

## 2019-04-01 MED ORDER — ONDANSETRON HCL 4 MG/2ML IJ SOLN
INTRAMUSCULAR | Status: AC
  Filled 2019-04-01: qty 2

## 2019-04-01 MED ORDER — ONDANSETRON 4 MG PO TBDP
4.0000 mg | ORAL_TABLET | Freq: Once | ORAL | Status: AC
  Administered 2019-04-01: 03:00:00 4 mg via ORAL

## 2019-04-01 MED ORDER — ONDANSETRON 4 MG PO TBDP: 4.0000 mg | ORAL_TABLET | Freq: Three times a day (TID) | ORAL | 0 refills | Status: AC | PRN

## 2019-04-01 NOTE — ED Notes (Signed)
Pt resting on stretcher with lights off to enhance rest. Pt states he is feeling much better and that his abd pain has gone away. Pt eyes closed and even respirations but easily aroused with verbal stimuli. Provided for comfort and safety and will continue to assess.

## 2019-04-01 NOTE — ED Notes (Signed)
Pt up to bedside commode. No distress noted. Pt state he is feeling much better. Pt pending discharge.

## 2019-04-01 NOTE — ED Notes (Signed)
Pt now c/o returning nausea. Provided for comfort and safety. MD aware.

## 2019-04-01 NOTE — ED Notes (Signed)
Pt states he is feeling a little better. Resting on stretcher with eyes closed and even respirations. MD aware.

## 2019-04-01 NOTE — ED Notes (Signed)
Pt actively vomiting. MD aware.

## 2019-04-01 NOTE — ED Notes (Signed)
Pt has not vomited any more. Pt states he is feeling a little better. Ambulated in exam room with steady gait.no distress noted. Preparing pt for discharge.

## 2019-04-01 NOTE — ED Notes (Signed)
Pt given ginger ale and saltine crackers per MD request.

## 2019-04-01 NOTE — ED Notes (Signed)
Pt states he has been able to eat one cracker and drink the ginger ale.

## 2020-01-25 DIAGNOSIS — R809 Proteinuria, unspecified: Secondary | ICD-10-CM | POA: Insufficient documentation

## 2020-01-25 DIAGNOSIS — K3184 Gastroparesis: Secondary | ICD-10-CM | POA: Insufficient documentation

## 2020-09-04 IMAGING — CT CT ANGIO CHEST
2 of 12 series · 11 of 46 positions shown · IV contrast (APPLIED)
Comparison: Chest x-ray from same day.

CLINICAL DATA: Left-sided chest pain and shortness of breath.

EXAM:
CT ANGIOGRAPHY CHEST WITH CONTRAST
TECHNIQUE: Multidetector CT imaging of the chest was performed using the
standard protocol during bolus administration of intravenous
contrast. Multiplanar CT image reconstructions and MIPs were
obtained to evaluate the vascular anatomy.
CONTRAST:  75mL OMNIPAQUE IOHEXOL 350 MG/ML SOLN

[Series 7: coronal mpr · coronal · 0.50mm/px · 1 of 97 slices shown]
[im 49/97  soft-tissue]
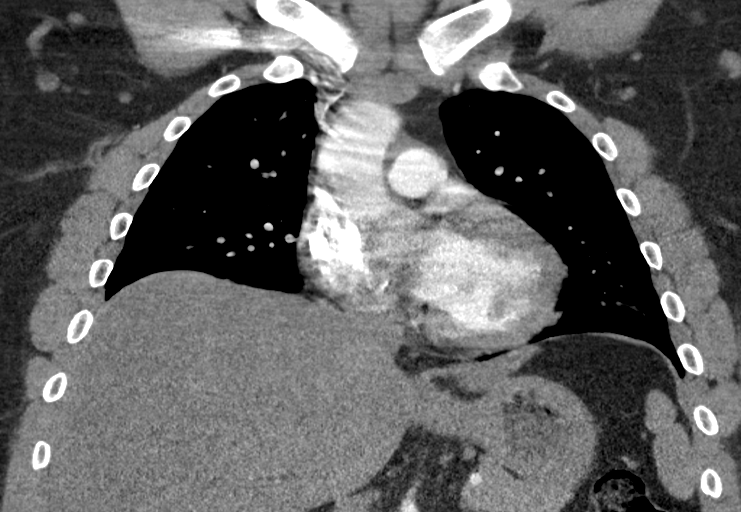

[Series 15: thins · axial · 0.83mm/px · z∈[-352,-131]mm · 10 of 271 slices shown]
[im 25/271  lung]
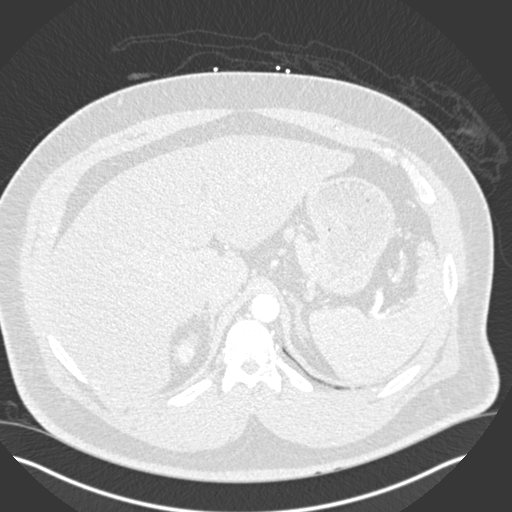
[im 50/271  soft-tissue]
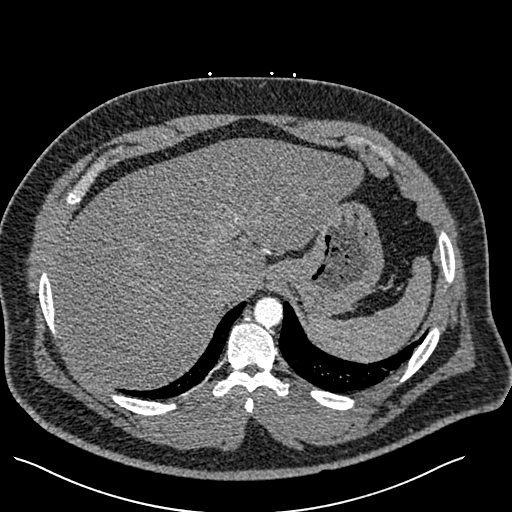
[im 74/271  lung]
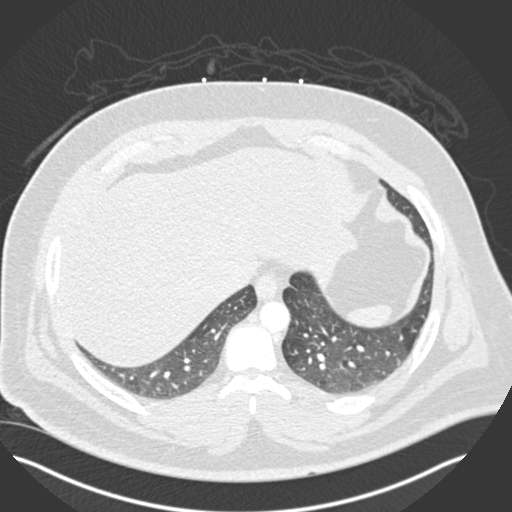
[im 99/271  soft-tissue]
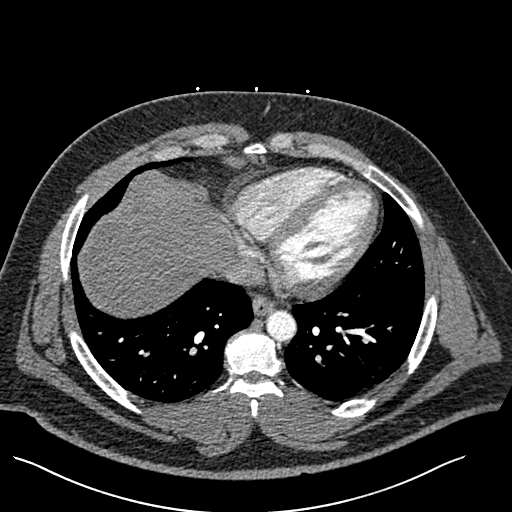
[im 123/271  lung]
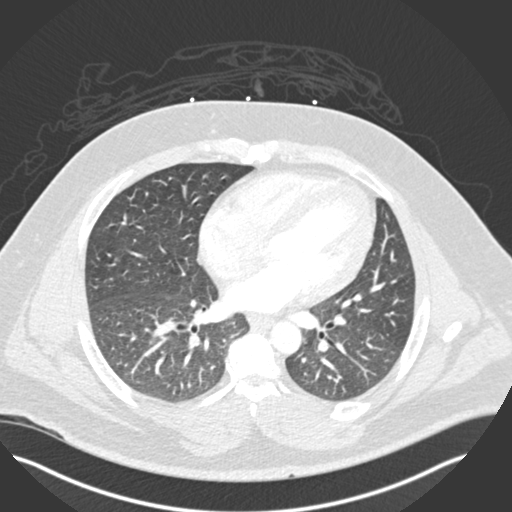
[im 148/271  soft-tissue]
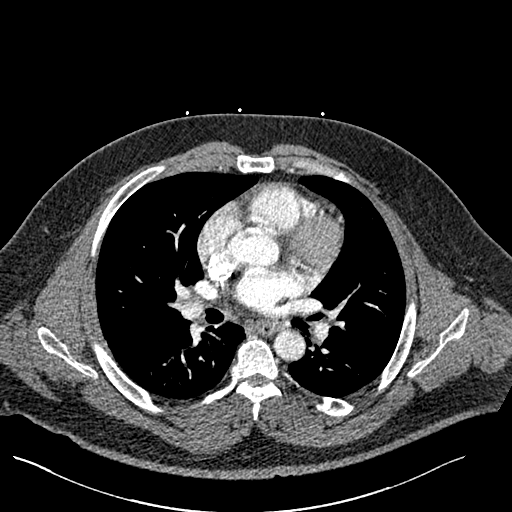
[im 172/271  lung]
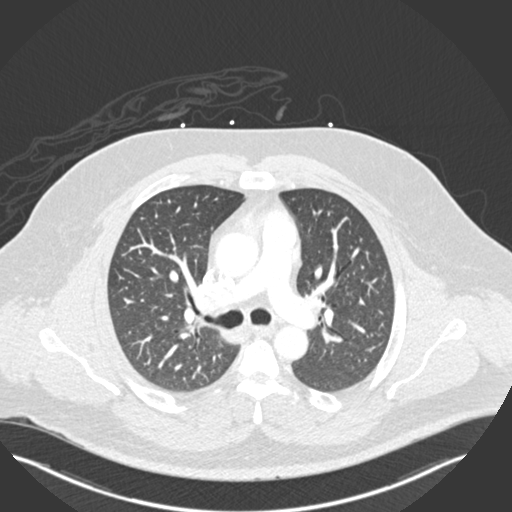
[im 197/271  soft-tissue]
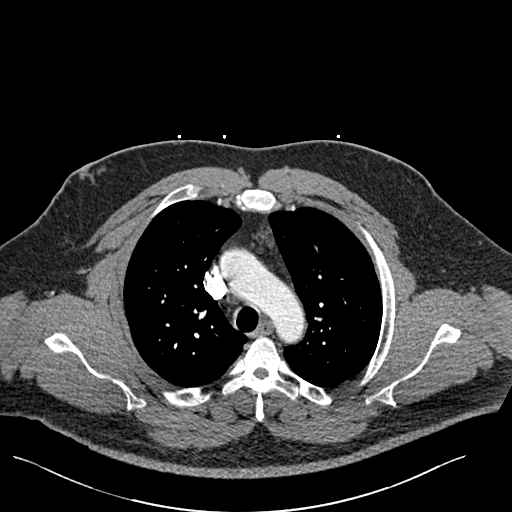
[im 221/271  lung]
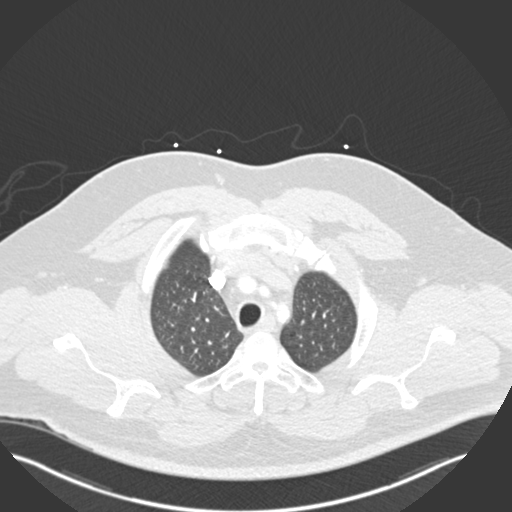
[im 246/271  soft-tissue]
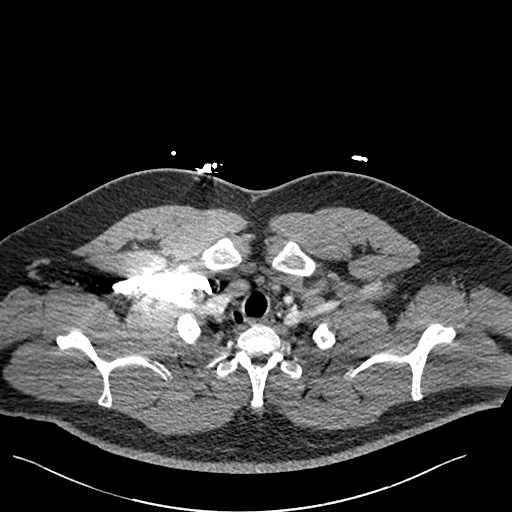

[11 of 46 positions shown; findings below may reference images not displayed]

FINDINGS: Cardiovascular: No central or lobar pulmonary embolism. Suboptimal
opacification of the segmental pulmonary arteries. Normal heart
size. No pericardial effusion. No thoracic aortic aneurysm or
dissection.

Mediastinum/Nodes: Prominent subcarinal lymph node measuring 10 mm
in short axis, likely reactive no enlarged mediastinal, hilar, or
axillary lymph nodes. Thyroid gland, trachea, and esophagus
demonstrate no significant findings.

Lungs/Pleura: Subsegmental atelectasis and mild mosaic attenuation
in both lower lobes. 5 mm pulmonary nodule in the superior segment
of the right lower lobe (series 6, image 41). 6 mm pulmonary nodule
in the right lower lobe (series 6, image 49). 5 mm pulmonary nodule
in the lingula (series 6, image 41). No focal consolidation, pleural
effusion, or pneumothorax.

Upper Abdomen: No acute abnormality.  Hepatic steatosis.

Musculoskeletal: No chest wall abnormality. No acute or significant
osseous findings.

Review of the MIP images confirms the above findings.
IMPRESSION: 1. No central or lobar pulmonary embolism. Segmental pulmonary
arteries are not well evaluated.
2. Subsegmental atelectasis and mild mosaic attenuation in both
lower lobes, suggestive of air trapping/small airways disease.
3. Several bilateral lower lobe pulmonary nodules measuring up to 6
mm. Non-contrast chest CT at 3-6 months is recommended. If the
nodules are stable at time of repeat CT, then future CT at 18-24
months (from today's scan) is considered optional for low-risk
patients, but is recommended for high-risk patients. This
recommendation follows the consensus statement: Guidelines for
Management of Incidental Pulmonary Nodules Detected on CT Images:
4. Hepatic steatosis.

## 2020-11-26 DIAGNOSIS — N529 Male erectile dysfunction, unspecified: Secondary | ICD-10-CM | POA: Insufficient documentation

## 2021-01-05 ENCOUNTER — Other Ambulatory Visit (HOSPITAL_COMMUNITY): Payer: Self-pay | Admitting: Family Medicine

## 2021-02-21 ENCOUNTER — Other Ambulatory Visit: Payer: Self-pay

## 2021-02-21 MED FILL — Insulin NPH & Regular Susp Pen-Inj 100 Unit/ML (70-30): SUBCUTANEOUS | 30 days supply | Qty: 15 | Fill #0 | Status: AC

## 2021-02-22 ENCOUNTER — Other Ambulatory Visit: Payer: Self-pay

## 2021-03-19 ENCOUNTER — Other Ambulatory Visit: Payer: Self-pay

## 2021-03-19 MED ORDER — METOCLOPRAMIDE HCL 10 MG/10ML PO SOLN
ORAL | 0 refills | Status: AC
  Filled 2021-03-19: qty 120, 4d supply, fill #0

## 2021-03-19 MED ORDER — CLOTRIMAZOLE 1 % EX CREA: TOPICAL_CREAM | CUTANEOUS | 0 refills | Status: AC

## 2021-03-20 ENCOUNTER — Other Ambulatory Visit: Payer: Self-pay

## 2021-04-03 ENCOUNTER — Other Ambulatory Visit: Payer: Self-pay

## 2021-04-03 MED FILL — Insulin NPH & Regular Susp Pen-Inj 100 Unit/ML (70-30): SUBCUTANEOUS | 30 days supply | Qty: 15 | Fill #1 | Status: AC

## 2021-04-03 MED FILL — Insulin Pen Needle 32 G X 6 MM (1/4" or 15/64"): 50 days supply | Qty: 100 | Fill #0 | Status: AC

## 2021-04-03 MED FILL — Insulin Pen Needle 32 G X 6 MM (1/4" or 15/64"): 90 days supply | Qty: 100 | Fill #0 | Status: CN

## 2021-04-04 ENCOUNTER — Other Ambulatory Visit: Payer: Self-pay

## 2021-05-17 ENCOUNTER — Other Ambulatory Visit: Payer: Self-pay

## 2021-05-17 MED FILL — Insulin NPH & Regular Susp Pen-Inj 100 Unit/ML (70-30): SUBCUTANEOUS | 30 days supply | Qty: 15 | Fill #2 | Status: AC

## 2021-05-17 MED FILL — Amlodipine Besylate Tab 10 MG (Base Equivalent): ORAL | 90 days supply | Qty: 90 | Fill #0 | Status: AC

## 2021-05-17 MED FILL — Lisinopril Tab 20 MG: ORAL | 90 days supply | Qty: 90 | Fill #0 | Status: AC

## 2021-05-18 ENCOUNTER — Other Ambulatory Visit: Payer: Self-pay

## 2021-05-24 DIAGNOSIS — N1831 Chronic kidney disease, stage 3a: Secondary | ICD-10-CM | POA: Insufficient documentation

## 2021-05-26 DIAGNOSIS — R3121 Asymptomatic microscopic hematuria: Secondary | ICD-10-CM | POA: Insufficient documentation

## 2021-06-27 ENCOUNTER — Other Ambulatory Visit: Payer: Self-pay

## 2021-06-27 MED ORDER — UNIFINE PENTIPS 32G X 6 MM MISC
1 refills | Status: DC
  Filled 2021-06-27: qty 100, 50d supply, fill #0
  Filled 2021-10-03: qty 100, 50d supply, fill #1

## 2021-06-27 MED FILL — Insulin NPH & Regular Susp Pen-Inj 100 Unit/ML (70-30): SUBCUTANEOUS | 30 days supply | Qty: 15 | Fill #3 | Status: AC

## 2021-06-27 MED FILL — Insulin Pen Needle 32 G X 6 MM (1/4" or 15/64"): 50 days supply | Qty: 100 | Fill #1 | Status: CN

## 2021-06-28 ENCOUNTER — Other Ambulatory Visit: Payer: Self-pay

## 2021-07-30 ENCOUNTER — Other Ambulatory Visit: Payer: Self-pay

## 2021-07-30 MED FILL — Insulin NPH & Regular Susp Pen-Inj 100 Unit/ML (70-30): SUBCUTANEOUS | 30 days supply | Qty: 15 | Fill #4 | Status: AC

## 2021-07-31 ENCOUNTER — Other Ambulatory Visit: Payer: Self-pay

## 2021-08-23 ENCOUNTER — Other Ambulatory Visit: Payer: Self-pay

## 2021-08-23 MED FILL — Amlodipine Besylate Tab 10 MG (Base Equivalent): ORAL | 90 days supply | Qty: 90 | Fill #1 | Status: AC

## 2021-08-23 MED FILL — Lisinopril Tab 20 MG: ORAL | 90 days supply | Qty: 90 | Fill #1 | Status: AC

## 2021-08-30 ENCOUNTER — Other Ambulatory Visit: Payer: Self-pay

## 2021-08-30 MED FILL — Insulin NPH & Regular Susp Pen-Inj 100 Unit/ML (70-30): SUBCUTANEOUS | 30 days supply | Qty: 15 | Fill #5 | Status: AC

## 2021-08-31 ENCOUNTER — Other Ambulatory Visit: Payer: Self-pay

## 2021-09-19 ENCOUNTER — Other Ambulatory Visit: Payer: Self-pay

## 2021-09-19 MED ORDER — ONDANSETRON 4 MG PO TBDP
ORAL_TABLET | ORAL | 0 refills | Status: AC
  Filled 2021-09-19: qty 15, 5d supply, fill #0

## 2021-09-19 MED ORDER — FAMOTIDINE 40 MG PO TABS
ORAL_TABLET | ORAL | 0 refills | Status: AC
  Filled 2021-09-19: qty 30, 30d supply, fill #0

## 2021-10-03 ENCOUNTER — Other Ambulatory Visit: Payer: Self-pay

## 2021-10-03 MED FILL — Insulin NPH & Regular Susp Pen-Inj 100 Unit/ML (70-30): SUBCUTANEOUS | 30 days supply | Qty: 15 | Fill #6 | Status: AC

## 2021-10-04 ENCOUNTER — Other Ambulatory Visit: Payer: Self-pay

## 2021-10-05 ENCOUNTER — Other Ambulatory Visit: Payer: Self-pay

## 2021-11-07 ENCOUNTER — Other Ambulatory Visit: Payer: Self-pay

## 2021-11-07 MED FILL — Insulin NPH & Regular Susp Pen-Inj 100 Unit/ML (70-30): SUBCUTANEOUS | 30 days supply | Qty: 15 | Fill #7 | Status: AC

## 2021-11-08 ENCOUNTER — Other Ambulatory Visit: Payer: Self-pay

## 2021-11-23 ENCOUNTER — Other Ambulatory Visit: Payer: Self-pay

## 2021-11-23 MED FILL — Lisinopril Tab 20 MG: ORAL | 90 days supply | Qty: 90 | Fill #2 | Status: AC

## 2021-11-23 MED FILL — Amlodipine Besylate Tab 10 MG (Base Equivalent): ORAL | 90 days supply | Qty: 90 | Fill #2 | Status: AC

## 2021-12-11 ENCOUNTER — Other Ambulatory Visit (HOSPITAL_COMMUNITY): Payer: Self-pay

## 2021-12-12 ENCOUNTER — Other Ambulatory Visit: Payer: Self-pay

## 2021-12-12 MED ORDER — UNIFINE PENTIPS 32G X 6 MM MISC
1 refills | Status: AC
  Filled 2021-12-12: qty 100, 50d supply, fill #0

## 2021-12-12 MED FILL — Insulin NPH & Regular Susp Pen-Inj 100 Unit/ML (70-30): SUBCUTANEOUS | 30 days supply | Qty: 15 | Fill #8 | Status: AC

## 2021-12-13 ENCOUNTER — Other Ambulatory Visit: Payer: Self-pay

## 2022-01-24 ENCOUNTER — Other Ambulatory Visit: Payer: Self-pay

## 2022-01-25 ENCOUNTER — Other Ambulatory Visit: Payer: Self-pay

## 2022-01-28 ENCOUNTER — Other Ambulatory Visit: Payer: Self-pay

## 2022-01-28 MED ORDER — HUMULIN 70/30 KWIKPEN (70-30) 100 UNIT/ML ~~LOC~~ SUPN
PEN_INJECTOR | SUBCUTANEOUS | 3 refills | Status: AC
  Filled 2022-01-28: qty 15, 30d supply, fill #0

## 2022-01-29 ENCOUNTER — Other Ambulatory Visit: Payer: Self-pay

## 2022-01-30 ENCOUNTER — Other Ambulatory Visit: Payer: Self-pay

## 2022-01-30 MED ORDER — HUMULIN 70/30 (70-30) 100 UNIT/ML ~~LOC~~ SUSP
SUBCUTANEOUS | 2 refills | Status: AC
  Filled 2022-01-30: qty 15, 30d supply, fill #0

## 2022-01-30 MED ORDER — LISINOPRIL 20 MG PO TABS
ORAL_TABLET | ORAL | 0 refills | Status: AC
  Filled 2022-01-30: qty 90, 90d supply, fill #0

## 2022-01-30 MED ORDER — ATORVASTATIN CALCIUM 40 MG PO TABS
ORAL_TABLET | ORAL | 3 refills | Status: AC
  Filled 2022-01-30: qty 90, 90d supply, fill #0

## 2022-01-30 MED ORDER — OMEPRAZOLE 20 MG PO CPDR
20.0000 mg | DELAYED_RELEASE_CAPSULE | Freq: Every day | ORAL | 0 refills | Status: AC
  Filled 2022-01-30: qty 30, 30d supply, fill #0

## 2022-01-30 MED ORDER — AMLODIPINE BESYLATE 10 MG PO TABS
10.0000 mg | ORAL_TABLET | Freq: Every day | ORAL | 0 refills | Status: AC
  Filled 2022-01-30: qty 90, 90d supply, fill #0

## 2022-02-12 ENCOUNTER — Other Ambulatory Visit: Payer: Self-pay

## 2022-06-04 DIAGNOSIS — E119 Type 2 diabetes mellitus without complications: Secondary | ICD-10-CM | POA: Diagnosis not present

## 2022-06-04 DIAGNOSIS — R197 Diarrhea, unspecified: Secondary | ICD-10-CM | POA: Diagnosis not present

## 2022-06-04 DIAGNOSIS — R112 Nausea with vomiting, unspecified: Secondary | ICD-10-CM | POA: Diagnosis not present

## 2022-06-04 DIAGNOSIS — R69 Illness, unspecified: Secondary | ICD-10-CM | POA: Diagnosis not present

## 2022-06-04 DIAGNOSIS — N1831 Chronic kidney disease, stage 3a: Secondary | ICD-10-CM | POA: Diagnosis not present

## 2022-06-04 DIAGNOSIS — I10 Essential (primary) hypertension: Secondary | ICD-10-CM | POA: Diagnosis not present

## 2022-06-04 DIAGNOSIS — R1110 Vomiting, unspecified: Secondary | ICD-10-CM | POA: Diagnosis not present

## 2022-06-04 DIAGNOSIS — R1084 Generalized abdominal pain: Secondary | ICD-10-CM | POA: Diagnosis not present

## 2022-08-10 ENCOUNTER — Ambulatory Visit
Admission: EM | Admit: 2022-08-10 | Discharge: 2022-08-10 | Disposition: A | Discharge status: Home / Self Care | Payer: Self-pay

## 2022-08-10 DIAGNOSIS — S61412A Laceration without foreign body of left hand, initial encounter: Secondary | ICD-10-CM

## 2022-08-10 NOTE — Discharge Instructions (Addendum)
Follow up here or with your primary care provider if your symptoms are worsening or not improving.     

## 2022-08-10 NOTE — ED Triage Notes (Signed)
Pt. Presents to UC w/ a minor cut the the palm of the left hand. Pt. Cut himself while moving a tent @ work.

## 2022-08-10 NOTE — ED Provider Notes (Signed)
Renaldo Fiddler    CSN: [id number] Arrival date & time: 08/10/22  1221      History   Chief Complaint Chief Complaint  Patient presents with   Hand Injury    HPI Armonie Ankith Edmonston is a 46 y.o. male.    Hand Injury   Patient presents to UC with complaint of a cut on the palm of his left hand.  He states he injured himself while moving a tent at work.  Elevated blood pressure is noted.  Past medical history significant for DM on insulin.  Past Medical History:  Diagnosis Date   Diabetes mellitus without complication [facility])     Patient Active Problem List   Diagnosis Date Noted   Asymptomatic microscopic hematuria 05/26/2021   Stage 3a chronic kidney disease (HCC) 05/24/2021   Erectile dysfunction 11/26/2020   Gastroparesis 01/25/2020   Nephrotic range proteinuria 01/25/2020   Respiratory syncytial virus infection 04/26/2003   Essential hypertension 12/29/2001   Hyperlipidemia 12/29/2001   Other postprocedural status(V45.89) 12/29/2001   Tobacco use disorder 12/29/2001   Type 2 diabetes mellitus with hyperglycemia, with long-term current use of insulin (HCC) 12/29/2001    History reviewed. No pertinent surgical history.     Home Medications    Prior to Admission medications   Medication Sig Start Date End Date Taking? Authorizing Provider  amLODipine (NORVASC) 10 MG tablet Take 1 tablet by mouth daily. 01/30/22 01/30/23 Yes [provider]  Lancet Devices (SIMPLE DIAGNOSTICS LANCING DEV) MISC use with lancets to check blood sugar every morning before breakfast. 01/05/21  Yes [provider]  lisinopril (ZESTRIL) 20 MG tablet Take by mouth. 01/30/22 01/30/23 Yes [provider]  omeprazole (PRILOSEC) 20 MG capsule Take 1 capsule by mouth daily. 01/30/22 01/30/23 Yes [provider]  albuterol (PROVENTIL HFA;VENTOLIN HFA) 108 (90 Base) MCG/ACT inhaler Inhale 2 puffs into the lungs every 6 (six) hours as needed for wheezing  or shortness of breath. 12/19/18   Willy Eddy, MD  amLODipine (NORVASC) 10 MG tablet Take 1 tablet (10 mg total) by mouth daily. 01/30/22     atorvastatin (LIPITOR) 40 MG tablet TAKE 1 TABLET BY MOUTH AT BEDTIME FOR HIGH CHOLESTEROL 01/05/21 01/05/22  Janece Canterbury, MD  atorvastatin (LIPITOR) 40 MG tablet Take 1 tablet by mouth at bedtime for high cholesterol 01/30/22     clotrimazole (LOTRIMIN) 1 % cream Apply topically Two (2) times a day. 03/18/21     famotidine (PEPCID) 40 MG tablet Take 1 tablet (40 mg total) by mouth nightly as needed for heartburn for up to 30 doses. 09/19/21     glipiZIDE (GLUCOTROL) 5 MG tablet Take 1 tablet (5 mg total) by mouth 2 (two) times daily. 01/29/18 01/29/19  Emily Filbert, MD  insulin glargine (LANTUS) 100 UNIT/ML injection Inject 25 Units into the skin at bedtime.    [provider]  insulin isophane & regular human KwikPen (HUMULIN 70/30 MIX) (70-30) 100 UNIT/ML KwikPen INJECT 22 UNITS UNDER THE SKIN 2 TIMES A DAY. 01/05/21 01/12/22  Janece Canterbury, MD  insulin isophane & regular human KwikPen (HUMULIN 70/30 KWIKPEN) (70-30) 100 UNIT/ML KwikPen INJECT 22 UNITS UNDER THE SKIN 2 TIMES A DAY. 01/26/22     insulin NPH-regular Human (HUMULIN 70/30) (70-30) 100 UNIT/ML injection Inject 0.22 mL (22 Units total) under the skin Two (2) times a day. 01/30/22     Insulin Pen Needle (UNIFINE PENTIPS) 32G X 6 MM MISC Use with insulin twice a day  12/12/21     lisinopril (ZESTRIL) 20 MG tablet Take 1 tablet (20 mg total) by mouth daily. For high blood pressure 01/30/22     LYCOPENE PO Take 1 tablet by mouth daily.    [provider]  metoCLOPramide (REGLAN) 10 MG/10ML SOLN Take 10 mL (10 mg total) by mouth Three (3) times a day as needed (nausea/vomiting). 03/18/21     omeprazole (PRILOSEC) 20 MG capsule Take 1 capsule (20 mg total) by mouth daily. 01/30/22     ondansetron (ZOFRAN ODT) 4 MG disintegrating tablet Take 1 tablet (4 mg total) by mouth every 8 (eight)  hours as needed. 04/01/19   Rudene Re, MD  ondansetron (ZOFRAN-ODT) 4 MG disintegrating tablet Take 1 tablet (4 mg total) by mouth every eight (8) hours as needed for nausea for up to 7 days. 09/19/21     tadalafil (CIALIS) 20 MG tablet TAKE 1 TABLET BY MOUTH DAILY AS NEEDED FOR ERECTILE DYSFUNCTION. 01/05/21 01/05/22  Shella Spearing, MD    Family History History reviewed. No pertinent family history.  Social History Social History   Tobacco Use   Smoking status: Every Day    Types: Cigarettes   Smokeless tobacco: Never  Substance Use Topics   Alcohol use: Yes    Comment: occ   Drug use: No     Allergies   Metformin   Review of Systems Review of Systems   Physical Exam Triage Vital Signs ED Triage Vitals  Enc Vitals Group     BP 08/10/22 1232 (!) 161/114     Pulse Rate 08/10/22 1232 96     Resp 08/10/22 1232 17     Temp 08/10/22 1232 98 F (36.7 C)     Temp src --      SpO2 08/10/22 1232 96 %     Weight --      Height --      Head Circumference --      Peak Flow --      Pain Score 08/10/22 1233 5     Pain Loc --      Pain Edu? --      Excl. in [place]? --    No data found.  Updated Vital Signs BP (!) 161/114   Pulse 96   Temp 98 F (36.7 C)   Resp 17   SpO2 96%   Visual Acuity Right Eye Distance:   Left Eye Distance:   Bilateral Distance:    Right Eye Near:   Left Eye Near:    Bilateral Near:     Physical Exam Vitals reviewed.  Constitutional:      Appearance: Normal appearance.  Musculoskeletal:       Hands:  Skin:    General: Skin is warm and dry.  Neurological:     General: No focal deficit present.     Mental Status: He is alert and oriented to person, place, and time.  Psychiatric:        Mood and Affect: Mood normal.        Behavior: Behavior normal.      UC Treatments / Results  Labs (all labs ordered are listed, but only abnormal results are displayed) Labs Reviewed - No data to display  EKG   Radiology No  results found.  Procedures Procedures (including critical care time)  Medications Ordered in UC Medications - No data to display  Initial Impression / Assessment and Plan / UC Course  I have reviewed the triage vital  signs and the nursing notes.  Pertinent labs & imaging results that were available during my care of the patient were reviewed by me and considered in my medical decision making (see chart for details).   The edges of the laceration on his left hand are approximated and sealed following bleeding in the immediate aftermath of the injury.  And no treatment in clinic is necessary. A work note was provided   Final Clinical Impressions(s) / UC Diagnoses   Final diagnoses:  None   Discharge Instructions   None    ED Prescriptions   None    PDMP not reviewed this encounter.   Charma Igo, [place] 08/10/22 1259

## 2022-08-16 DIAGNOSIS — N179 Acute kidney failure, unspecified: Secondary | ICD-10-CM | POA: Diagnosis not present

## 2022-08-16 DIAGNOSIS — R112 Nausea with vomiting, unspecified: Secondary | ICD-10-CM | POA: Diagnosis not present

## 2022-08-16 DIAGNOSIS — R197 Diarrhea, unspecified: Secondary | ICD-10-CM | POA: Diagnosis not present

## 2022-08-16 DIAGNOSIS — E785 Hyperlipidemia, unspecified: Secondary | ICD-10-CM | POA: Diagnosis not present

## 2022-08-16 DIAGNOSIS — Z888 Allergy status to other drugs, medicaments and biological substances status: Secondary | ICD-10-CM | POA: Diagnosis not present

## 2022-08-16 DIAGNOSIS — R000 Tachycardia, unspecified: Secondary | ICD-10-CM | POA: Diagnosis not present

## 2022-08-16 DIAGNOSIS — Z87891 Personal history of nicotine dependence: Secondary | ICD-10-CM | POA: Diagnosis not present

## 2022-08-16 DIAGNOSIS — E1122 Type 2 diabetes mellitus with diabetic chronic kidney disease: Secondary | ICD-10-CM | POA: Diagnosis not present

## 2022-08-16 DIAGNOSIS — I129 Hypertensive chronic kidney disease with stage 1 through stage 4 chronic kidney disease, or unspecified chronic kidney disease: Secondary | ICD-10-CM | POA: Diagnosis not present

## 2022-08-16 DIAGNOSIS — N1831 Chronic kidney disease, stage 3a: Secondary | ICD-10-CM | POA: Diagnosis not present

## 2022-08-16 DIAGNOSIS — Z20822 Contact with and (suspected) exposure to covid-19: Secondary | ICD-10-CM | POA: Diagnosis not present

## 2022-08-16 DIAGNOSIS — R1110 Vomiting, unspecified: Secondary | ICD-10-CM | POA: Diagnosis not present

## 2022-09-03 DIAGNOSIS — E785 Hyperlipidemia, unspecified: Secondary | ICD-10-CM | POA: Diagnosis not present

## 2022-09-03 DIAGNOSIS — I129 Hypertensive chronic kidney disease with stage 1 through stage 4 chronic kidney disease, or unspecified chronic kidney disease: Secondary | ICD-10-CM | POA: Diagnosis not present

## 2022-09-03 DIAGNOSIS — Z79899 Other long term (current) drug therapy: Secondary | ICD-10-CM | POA: Diagnosis not present

## 2022-09-03 DIAGNOSIS — K3184 Gastroparesis: Secondary | ICD-10-CM | POA: Diagnosis not present

## 2022-09-03 DIAGNOSIS — Z794 Long term (current) use of insulin: Secondary | ICD-10-CM | POA: Diagnosis not present

## 2022-09-03 DIAGNOSIS — E1143 Type 2 diabetes mellitus with diabetic autonomic (poly)neuropathy: Secondary | ICD-10-CM | POA: Diagnosis not present

## 2022-09-03 DIAGNOSIS — E1122 Type 2 diabetes mellitus with diabetic chronic kidney disease: Secondary | ICD-10-CM | POA: Diagnosis not present

## 2022-09-03 DIAGNOSIS — M25561 Pain in right knee: Secondary | ICD-10-CM | POA: Diagnosis not present

## 2022-09-03 DIAGNOSIS — Z888 Allergy status to other drugs, medicaments and biological substances status: Secondary | ICD-10-CM | POA: Diagnosis not present

## 2022-09-03 DIAGNOSIS — Z87891 Personal history of nicotine dependence: Secondary | ICD-10-CM | POA: Diagnosis not present

## 2022-09-03 DIAGNOSIS — N1831 Chronic kidney disease, stage 3a: Secondary | ICD-10-CM | POA: Diagnosis not present

## 2022-09-03 DIAGNOSIS — R55 Syncope and collapse: Secondary | ICD-10-CM | POA: Diagnosis not present

## 2022-09-04 DIAGNOSIS — R55 Syncope and collapse: Secondary | ICD-10-CM | POA: Diagnosis not present

## 2022-11-13 DIAGNOSIS — Z79899 Other long term (current) drug therapy: Secondary | ICD-10-CM | POA: Diagnosis not present

## 2022-11-13 DIAGNOSIS — R42 Dizziness and giddiness: Secondary | ICD-10-CM | POA: Diagnosis not present

## 2022-11-13 DIAGNOSIS — K3184 Gastroparesis: Secondary | ICD-10-CM | POA: Diagnosis not present

## 2022-11-13 DIAGNOSIS — R112 Nausea with vomiting, unspecified: Secondary | ICD-10-CM | POA: Diagnosis not present

## 2022-11-13 DIAGNOSIS — E1143 Type 2 diabetes mellitus with diabetic autonomic (poly)neuropathy: Secondary | ICD-10-CM | POA: Diagnosis not present

## 2022-11-13 DIAGNOSIS — D72829 Elevated white blood cell count, unspecified: Secondary | ICD-10-CM | POA: Diagnosis not present

## 2022-11-13 DIAGNOSIS — R1084 Generalized abdominal pain: Secondary | ICD-10-CM | POA: Diagnosis not present

## 2022-11-13 DIAGNOSIS — K760 Fatty (change of) liver, not elsewhere classified: Secondary | ICD-10-CM | POA: Diagnosis not present

## 2022-11-13 DIAGNOSIS — I129 Hypertensive chronic kidney disease with stage 1 through stage 4 chronic kidney disease, or unspecified chronic kidney disease: Secondary | ICD-10-CM | POA: Diagnosis not present

## 2022-11-13 DIAGNOSIS — R519 Headache, unspecified: Secondary | ICD-10-CM | POA: Diagnosis not present

## 2022-11-13 DIAGNOSIS — K219 Gastro-esophageal reflux disease without esophagitis: Secondary | ICD-10-CM | POA: Diagnosis not present

## 2022-11-13 DIAGNOSIS — Z1152 Encounter for screening for COVID-19: Secondary | ICD-10-CM | POA: Diagnosis not present

## 2022-11-13 DIAGNOSIS — R69 Illness, unspecified: Secondary | ICD-10-CM | POA: Diagnosis not present

## 2022-11-13 DIAGNOSIS — E1122 Type 2 diabetes mellitus with diabetic chronic kidney disease: Secondary | ICD-10-CM | POA: Diagnosis not present

## 2022-11-13 DIAGNOSIS — F17200 Nicotine dependence, unspecified, uncomplicated: Secondary | ICD-10-CM | POA: Diagnosis not present

## 2022-11-13 DIAGNOSIS — E1165 Type 2 diabetes mellitus with hyperglycemia: Secondary | ICD-10-CM | POA: Diagnosis not present

## 2022-11-13 DIAGNOSIS — E860 Dehydration: Secondary | ICD-10-CM | POA: Diagnosis not present

## 2022-11-13 DIAGNOSIS — R509 Fever, unspecified: Secondary | ICD-10-CM | POA: Diagnosis not present

## 2022-11-13 DIAGNOSIS — Z794 Long term (current) use of insulin: Secondary | ICD-10-CM | POA: Diagnosis not present

## 2022-11-13 DIAGNOSIS — E785 Hyperlipidemia, unspecified: Secondary | ICD-10-CM | POA: Diagnosis not present

## 2022-11-13 DIAGNOSIS — N1831 Chronic kidney disease, stage 3a: Secondary | ICD-10-CM | POA: Diagnosis not present

## 2022-11-13 DIAGNOSIS — R12 Heartburn: Secondary | ICD-10-CM | POA: Diagnosis not present

## 2022-11-13 DIAGNOSIS — I10 Essential (primary) hypertension: Secondary | ICD-10-CM | POA: Diagnosis not present

## 2022-11-13 DIAGNOSIS — R109 Unspecified abdominal pain: Secondary | ICD-10-CM | POA: Diagnosis not present
